# Patient Record
Sex: Female | Born: 1950
Health system: Southern US, Community
[De-identification: ages and names within clinical notes are randomized; demographics above are authoritative.]

## PROBLEM LIST (undated history)

## (undated) DIAGNOSIS — E049 Nontoxic goiter, unspecified: Secondary | ICD-10-CM

## (undated) DIAGNOSIS — M1990 Unspecified osteoarthritis, unspecified site: Secondary | ICD-10-CM

## (undated) DIAGNOSIS — I480 Paroxysmal atrial fibrillation: Secondary | ICD-10-CM

## (undated) DIAGNOSIS — I4891 Unspecified atrial fibrillation: Secondary | ICD-10-CM

## (undated) DIAGNOSIS — I10 Essential (primary) hypertension: Secondary | ICD-10-CM

## (undated) DIAGNOSIS — C4491 Basal cell carcinoma of skin, unspecified: Secondary | ICD-10-CM

## (undated) DIAGNOSIS — E785 Hyperlipidemia, unspecified: Secondary | ICD-10-CM

## (undated) DIAGNOSIS — G930 Cerebral cysts: Secondary | ICD-10-CM

## (undated) HISTORY — DX: Cerebral cysts: G93.0

## (undated) HISTORY — DX: Hyperlipidemia, unspecified: E78.5

## (undated) HISTORY — DX: Essential (primary) hypertension: I10

## (undated) HISTORY — PX: TONSILLECTOMY AND ADENOIDECTOMY: SHX28

## (undated) HISTORY — DX: Unspecified atrial fibrillation: I48.91

## (undated) HISTORY — PX: BASAL CELL CARCINOMA EXCISION: SHX1214

## (undated) HISTORY — DX: Basal cell carcinoma of skin, unspecified: C44.91

## (undated) HISTORY — DX: Paroxysmal atrial fibrillation: I48.0

## (undated) HISTORY — DX: Unspecified osteoarthritis, unspecified site: M19.90

## (undated) HISTORY — PX: HYSTERECTOMY ABDOMINAL WITH SALPINGECTOMY: SHX6725

## (undated) HISTORY — DX: Nontoxic goiter, unspecified: E04.9

---

## 1999-07-29 ENCOUNTER — Encounter: Admission: RE | Admit: 1999-07-29 | Discharge: 1999-07-29 | Payer: Self-pay | Admitting: Obstetrics and Gynecology

## 1999-07-29 ENCOUNTER — Encounter: Payer: Self-pay | Admitting: Obstetrics and Gynecology

## 2000-08-11 ENCOUNTER — Encounter: Payer: Self-pay | Admitting: Obstetrics and Gynecology

## 2000-08-11 ENCOUNTER — Encounter: Admission: RE | Admit: 2000-08-11 | Discharge: 2000-08-11 | Payer: Self-pay | Admitting: Obstetrics and Gynecology

## 2001-07-27 ENCOUNTER — Encounter: Payer: Self-pay | Admitting: Emergency Medicine

## 2001-07-27 ENCOUNTER — Emergency Department (HOSPITAL_COMMUNITY): Admission: EM | Admit: 2001-07-27 | Discharge: 2001-07-27 | Payer: Self-pay | Admitting: Emergency Medicine

## 2001-08-17 ENCOUNTER — Encounter: Payer: Self-pay | Admitting: Obstetrics and Gynecology

## 2001-08-17 ENCOUNTER — Encounter: Admission: RE | Admit: 2001-08-17 | Discharge: 2001-08-17 | Payer: Self-pay | Admitting: Obstetrics and Gynecology

## 2001-09-14 ENCOUNTER — Ambulatory Visit (HOSPITAL_COMMUNITY): Admission: RE | Admit: 2001-09-14 | Discharge: 2001-09-14 | Payer: Self-pay | Admitting: Obstetrics and Gynecology

## 2001-09-14 ENCOUNTER — Encounter: Payer: Self-pay | Admitting: Obstetrics and Gynecology

## 2007-01-04 ENCOUNTER — Encounter: Payer: Self-pay | Admitting: Family Medicine

## 2007-06-27 ENCOUNTER — Encounter: Admission: RE | Admit: 2007-06-27 | Discharge: 2007-06-27 | Payer: Self-pay | Admitting: Obstetrics and Gynecology

## 2008-07-04 ENCOUNTER — Ambulatory Visit (HOSPITAL_COMMUNITY): Admission: RE | Admit: 2008-07-04 | Discharge: 2008-07-04 | Payer: Self-pay | Admitting: Family Medicine

## 2009-01-02 ENCOUNTER — Encounter: Admission: RE | Admit: 2009-01-02 | Discharge: 2009-01-02 | Payer: Self-pay | Admitting: Endocrinology

## 2010-11-13 ENCOUNTER — Other Ambulatory Visit: Payer: Self-pay | Admitting: Endocrinology

## 2010-11-13 DIAGNOSIS — E041 Nontoxic single thyroid nodule: Secondary | ICD-10-CM

## 2011-01-05 ENCOUNTER — Other Ambulatory Visit: Payer: Self-pay

## 2011-01-05 ENCOUNTER — Ambulatory Visit
Admission: RE | Admit: 2011-01-05 | Discharge: 2011-01-05 | Disposition: A | Discharge status: Home / Self Care | Payer: BC Managed Care – PPO | Source: Ambulatory Visit | Attending: Endocrinology | Admitting: Endocrinology

## 2011-01-05 DIAGNOSIS — E041 Nontoxic single thyroid nodule: Secondary | ICD-10-CM

## 2011-02-03 ENCOUNTER — Ambulatory Visit (HOSPITAL_COMMUNITY)
Admission: RE | Admit: 2011-02-03 | Discharge: 2011-02-03 | Disposition: A | Discharge status: Home / Self Care | Payer: BC Managed Care – PPO | Source: Ambulatory Visit | Attending: Physical Medicine and Rehabilitation | Admitting: Physical Medicine and Rehabilitation

## 2011-02-03 DIAGNOSIS — IMO0001 Reserved for inherently not codable concepts without codable children: Secondary | ICD-10-CM | POA: Insufficient documentation

## 2011-02-03 DIAGNOSIS — M545 Low back pain, unspecified: Secondary | ICD-10-CM | POA: Insufficient documentation

## 2011-02-03 DIAGNOSIS — M25559 Pain in unspecified hip: Secondary | ICD-10-CM | POA: Insufficient documentation

## 2011-02-03 DIAGNOSIS — M79609 Pain in unspecified limb: Secondary | ICD-10-CM | POA: Insufficient documentation

## 2011-02-03 DIAGNOSIS — M6281 Muscle weakness (generalized): Secondary | ICD-10-CM | POA: Insufficient documentation

## 2011-02-03 NOTE — Progress Notes (Signed)
Physical Therapy Evaluation  Patient Details  Name: MARJORIE DEPREY MRN: [id number] Date of Birth: 1951/01/21  Today's Date: 02/03/2011 Time: 0105-0146 Time Calculation (min): 41 min Visit#: 1  of 8   Re-eval: 03/05/11 Assessment Diagnosis: DJD Next MD Visit: 02/25/11 Prior Therapy: none  Past Medical History: No past medical history on file. Past Surgical History: No past surgical history on file.  Subjective Symptoms/Limitations Symptoms: Ms. Stillson states a year and a half ago she started have pain in the upper part of her posterior thigh driving home from [place].  The patient states that whenever she rides in a car  she has significant increased pain .  She states that her pain also increases whenever she bends forward.  She had a MRI a month ago which showed some degeneration at L5-S1.  She has tried alleve and ibuprofen with some relief.  She was given prednisone and the pain went completely away but it came back within a week.  She is now being referred to therapy to improve her functional ability.  Limitations: Sitting How long can you sit comfortably?: The patient states that she is able to sit for less than ten minutes. How long can you stand comfortably?: The patient states that her back does not bother her when she is standing How long can you walk comfortably?: No pain with walking Pain Assessment Currently in Pain?: Yes Pain Score:   1 (pain increases to a 4 with car riding.) Pain Location: Back Pain Orientation: Right Pain Type: Acute pain Pain Radiating Towards: right thight Pain Onset: More than a month ago Pain Frequency: Intermittent Pain Relieving Factors: standing up Effect of Pain on Daily Activities: sitting, bending and climbing steps increases pain.  Assessment RLE Strength Right Hip Flexion: 5/5 Right Hip Extension: 4/5 Right Hip ABduction: 5/5 Right Hip ADduction: 5/5 Right Knee Flexion: 5/5 Right Knee Extension: 5/5 Right Ankle Dorsiflexion:  5/5 Right Ankle Plantar Flexion: 5/5 Lumbar AROM Lumbar Flexion: decreased 20% reps increases Lumbar Extension: wnl reps no change Lumbar - Right Side Bend: wnl  Lumbar - Left Side Bend: wnl Lumbar - Right Rotation: wnl Lumbar - Left Rotation: wnl  Exercise/Treatments Stretches Active Hamstring Stretch: 3 reps;30 seconds Single Knee to Chest Stretch: 3 reps;30 seconds Lumbar Exercises  stand extension x 5 Stability Ab Set: 10 reps   Physical Therapy Assessment and Plan PT Assessment and Plan Clinical Impression Statement: Pt with pain affecting her ability to sit, car ride and work in the yard.  Pt will be benefit from skilled PT to return pt to prior level and improve her quality of life. Rehab Potential: Good Clinical Impairments Affecting Rehab Potential: weak trunk stabilizer, pain PT Frequency: Min 2X/week PT Duration: 4 weeks PT Treatment/Interventions: Therapeutic exercise;Functional mobility training;Patient/family education PT Plan: begin bent knee lift, bridge, hip flexion isometrically, SL abduction and all 4 piriformis stretch to next treatment.    Goals Home Exercise Program Pt will Perform Home Exercise Program: Independently PT Short Term Goals Time to Complete Short Term Goals: 2 weeks PT Short Term Goal 1: Patient to state that she is able to sit for 30 min. without incrased Pain PT Short Term Goal 2: Pt to be able to demonstrate and verbalize the importane of HEP PT Short Term Goal 3: Pain level to be no greater than a 2 PT Short Term Goal 4: ROM WFL PT Long Term Goals Time to Complete Long Term Goals: 4 weeks PT Long Term Goal 1: I in advance HEP  PT Long Term Goal 2: Pt to state she had had no leg pain in a week period Long Term Goal 3: Pain level at the most a 1 Long Term Goal 4: Pt able to sit for 1 1/2 hrs. without incrased pain. PT Long Term Goal 5: Able to work out in yard without discomfort  Problem List Patient Active Problem List  Diagnoses    . Pain in joint, pelvic region and thigh    PT - End of Session Activity Tolerance: Patient tolerated treatment well General Behavior During Session: [facility] for tasks performed Cognition: [facility] for tasks performed   Shynia Daleo,CINDY 02/03/2011, 4:31 PM  Physician Documentation Your signature is required to indicate approval of the treatment plan as stated above.  Please sign and either send electronically or make a copy of this report for your files and return this physician signed original.   Please mark one 1.__approve of plan  2. ___approve of plan with the following conditions.   ______________________________                                                          _____________________ Physician Signature                                                                                                             Date

## 2011-02-03 NOTE — Patient Instructions (Addendum)
HEP

## 2011-02-10 ENCOUNTER — Ambulatory Visit (HOSPITAL_COMMUNITY)
Admission: RE | Admit: 2011-02-10 | Discharge: 2011-02-10 | Disposition: A | Discharge status: Home / Self Care | Payer: BC Managed Care – PPO | Source: Ambulatory Visit | Attending: Physical Medicine and Rehabilitation | Admitting: Physical Medicine and Rehabilitation

## 2011-02-10 NOTE — Progress Notes (Signed)
Physical Therapy Treatment Patient Details  Name: Paige Jones MRN: [id number] Date of Birth: 09/24/1950  Today's Date: 02/10/2011 Time: 2130-8657 Time Calculation (min): 34 min Visit#: 2  of 8   Re-eval: 03/05/11 Charges: Therex x 32'  Subjective: Symptoms/Limitations Symptoms: Pt reprots pain in her R hip only when she moves a certain way. Pain Assessment Currently in Pain?: Yes Pain Score:   2 Pain Location: Hip Pain Orientation: Right  Exercise/Treatments Stretches Active Hamstring Stretch: 3 reps;30 seconds Single Knee to Chest Stretch: 3 reps;30 seconds Piriformis Stretch: 3 reps;30 seconds;Limitations Piriformis Stretch Limitations: quadruped Stability Bent Knee Raise: 10 reps Ab Set: 10 reps Isometric Hip Flexion: 10 reps Hip Abduction: 10 reps;Side-lying  Physical Therapy Assessment and Plan PT Assessment and Plan Clinical Impression Statement: Pt completes therex with good core control. Pt requires minimal cueing for proper technique. Pt w/o c/o increased pain thourghout session. PT Treatment/Interventions: Therapeutic exercise PT Plan: Continue per PT POC.    Goals    Problem List Patient Active Problem List  Diagnoses  . Pain in joint, pelvic region and thigh    PT - End of Session Activity Tolerance: Patient tolerated treatment well General Behavior During Session: [facility] for tasks performed Cognition: [facility] for tasks performed  Antonieta Iba 02/10/2011, 5:20 PM

## 2011-02-17 ENCOUNTER — Ambulatory Visit (HOSPITAL_COMMUNITY)
Admission: RE | Admit: 2011-02-17 | Discharge: 2011-02-17 | Disposition: A | Discharge status: Home / Self Care | Payer: BC Managed Care – PPO | Source: Ambulatory Visit | Attending: Family Medicine | Admitting: Family Medicine

## 2011-02-17 NOTE — Progress Notes (Signed)
Physical Therapy Treatment Patient Details  Name: DEBBERA WOLKEN MRN: [id number] Date of Birth: [birth year]  Today's Date: 02/17/2011 Time: 4098-1191 Time Calculation (min): 39 min Visit#: 3  of 8   Re-eval: 03/05/11 Charges: Therex x 38'  Subjective: Symptoms/Limitations Symptoms: Pt reports that she did some yardwork yesterday and she's a little tight. Pain Assessment Currently in Pain?: No/denies Pain Score: 0-No pain   Exercise/Treatments Stretches Active Hamstring Stretch: 3 reps;30 seconds Single Knee to Chest Stretch: 3 reps;30 seconds Piriformis Stretch: 3 reps;30 seconds;Limitations Piriformis Stretch Limitations: quadruped Stability Bent Knee Raise: 15 reps Isometric Hip Flexion: 15 reps Large Ball Abdominal Isometric: 10 reps Large Ball Oblique Isometric: 10 reps Straight Leg Raise: 10 reps Hip Abduction: 10 reps;Side-lying;Weights Hip Abduction Weights (lbs): 3 Single Arm Raise: 10 reps;Prone Leg Raise: 10 reps;Prone Opposite Arm/Leg Raise: 10 reps;Prone  Physical Therapy Assessment and Plan PT Assessment and Plan Clinical Impression Statement: Pt completes all exercises with proper form and good control. Began pbal abdominal isometric exercise to improve core strength. Began prone ex to improve hip ext and back strength. PT Plan: Continue to progress strength flexibility.     Problem List Patient Active Problem List  Diagnoses  . Pain in joint, pelvic region and thigh    PT - End of Session Activity Tolerance: Patient tolerated treatment well General Behavior During Session: [facility] for tasks performed Cognition: [facility] for tasks performed  Antonieta Iba 02/17/2011, 1:55 PM

## 2011-02-18 ENCOUNTER — Ambulatory Visit (HOSPITAL_COMMUNITY): Payer: BC Managed Care – PPO | Admitting: Physical Therapy

## 2011-02-19 ENCOUNTER — Ambulatory Visit (HOSPITAL_COMMUNITY): Payer: BC Managed Care – PPO | Admitting: *Deleted

## 2011-02-23 ENCOUNTER — Ambulatory Visit (HOSPITAL_COMMUNITY): Payer: BC Managed Care – PPO | Admitting: Physical Therapy

## 2011-02-23 ENCOUNTER — Telehealth (HOSPITAL_COMMUNITY): Payer: Self-pay

## 2011-02-25 ENCOUNTER — Ambulatory Visit (HOSPITAL_COMMUNITY): Payer: BC Managed Care – PPO | Admitting: Physical Therapy

## 2011-02-27 ENCOUNTER — Ambulatory Visit (HOSPITAL_COMMUNITY)
Admission: RE | Admit: 2011-02-27 | Discharge: 2011-02-27 | Disposition: A | Discharge status: Home / Self Care | Payer: BC Managed Care – PPO | Source: Ambulatory Visit | Attending: Physical Medicine and Rehabilitation | Admitting: Physical Medicine and Rehabilitation

## 2011-02-27 DIAGNOSIS — M6281 Muscle weakness (generalized): Secondary | ICD-10-CM | POA: Insufficient documentation

## 2011-02-27 DIAGNOSIS — M545 Low back pain, unspecified: Secondary | ICD-10-CM | POA: Insufficient documentation

## 2011-02-27 DIAGNOSIS — M25559 Pain in unspecified hip: Secondary | ICD-10-CM

## 2011-02-27 DIAGNOSIS — M79609 Pain in unspecified limb: Secondary | ICD-10-CM | POA: Insufficient documentation

## 2011-02-27 DIAGNOSIS — IMO0001 Reserved for inherently not codable concepts without codable children: Secondary | ICD-10-CM | POA: Insufficient documentation

## 2011-02-27 NOTE — Progress Notes (Signed)
Physical Therapy Treatment Patient Details  Name: Paige Jones MRN: [id number] Date of Birth: 1951-01-02  Today's Date: 02/27/2011 Time: 4098-1191 Time Calculation (min): 42 min Visit#: 4  of 8   Re-eval: 03/05/11  charge There ex 40  Subjective: Symptoms/Limitations Symptoms: Pt states that she is doing pretty good today Pain Assessment Currently in Pain?: No/denies    Exercise/Treatments Stretches Active Hamstring Stretch: 3 reps;30 seconds Single Knee to Chest Stretch: 3 reps;30 seconds Piriformis Stretch: 3 reps;30 seconds;Limitations Piriformis Stretch Limitations: quadruped Lumbar Exercises   Stability Dead Bug: 15 reps Bridge: 15 reps;Limitations Bridge Limitations: legs on ball Large Ball Abdominal Isometric: 15 reps Large Ball Oblique Isometric: 15 reps Straight Leg Raise: 15 reps Hip Abduction: 10 reps;Side-lying;Weights Hip Abduction Weights (lbs): 3 Single Arm Raise: Prone;15 reps Leg Raise: Prone;15 reps Opposite Arm/Leg Raise: Prone;15 reps  Physical Therapy Assessment and Plan PT Assessment and Plan Clinical Impression Statement: Good technique with all exercise.  Added dead bug. Rehab Potential: Good PT Plan: begin all 4 SAR/SLR/ opposite arm/leg    Goals    Problem List Patient Active Problem List  Diagnoses  . Pain in joint, pelvic region and thigh    PT - End of Session Activity Tolerance: Patient tolerated treatment well General Behavior During Session: [facility] for tasks performed Cognition: [facility] for tasks performed  RUSSELL,CINDY 02/27/2011, 1:46 PM

## 2011-03-02 ENCOUNTER — Ambulatory Visit (HOSPITAL_COMMUNITY)
Admission: RE | Admit: 2011-03-02 | Discharge: 2011-03-02 | Disposition: A | Discharge status: Home / Self Care | Payer: BC Managed Care – PPO | Source: Ambulatory Visit | Attending: Family Medicine | Admitting: Family Medicine

## 2011-03-02 NOTE — Progress Notes (Signed)
Physical Therapy Treatment Patient Details  Name: Paige Jones MRN: [id number] Date of Birth: [birth year]  Today's Date: 03/02/2011 Time: 2952-8413 Time Calculation (min): 40 min Visit#: 5  of 8   Re-eval: 03/05/11 Charges:  therex 38'    Subjective: Symptoms/Limitations Symptoms: Mainly pain on the Right side, but most of it is at the crease of glutes; was sitting on a stool over weekend and has been sore there since. Pain Assessment Currently in Pain?: Yes Pain Score:   4 Pain Location: Hip Pain Orientation: Right   Exercise/Treatments Stretches Active Hamstring Stretch: 3 reps;30 seconds Single Knee to Chest Stretch: 3 reps;30 seconds Piriformis Stretch: 3 reps;30 seconds;Limitations Piriformis Stretch Limitations: supine with towel Stability Dead Bug: 15 reps Bridge: 15 reps Bridge Limitations: legs on ball Isometric Hip Flexion: 15 reps Large Ball Abdominal Isometric: 15 reps Large Ball Oblique Isometric: 15 reps Straight Leg Raise: 15 reps Hip Abduction: 15 reps Hip Abduction Weights (lbs): 3 Single Arm Raise: Quadruped;5 reps Leg Raise: 5 reps;Other (comment) (quadruped) Opposite Arm/Leg Raise: Quadruped;5 reps  Physical Therapy Assessment and Plan PT Assessment and Plan Clinical Impression Statement: Pt. required minimal  cues with therex; increasing strength and stability.  Progressed to quadruped with most difficulty stabilizing L UE/R LE. PT Plan: Re-evaluate next visit.     Problem List Patient Active Problem List  Diagnoses  . Pain in joint, pelvic region and thigh    PT - End of Session Activity Tolerance: Patient tolerated treatment well General Behavior During Session: [facility] for tasks performed Cognition: [facility] for tasks performed  Emeline Gins B 03/02/2011, 1:47 PM

## 2011-03-04 ENCOUNTER — Ambulatory Visit (HOSPITAL_COMMUNITY)
Admission: RE | Admit: 2011-03-04 | Discharge: 2011-03-04 | Disposition: A | Discharge status: Home / Self Care | Payer: BC Managed Care – PPO | Source: Ambulatory Visit | Attending: Family Medicine | Admitting: Family Medicine

## 2011-03-04 DIAGNOSIS — M25559 Pain in unspecified hip: Secondary | ICD-10-CM

## 2011-03-04 NOTE — Progress Notes (Signed)
Physical Therapy Treatment Patient Details  Name: Paige Jones MRN: [id number] Date of Birth: Jul 13, 1950  Today's Date: 03/04/2011 Time: 4098-1191 Time Calculation (min): 38 min Visit#: 6  of 8   Re-eval: 03/05/11  Charge: therex 30 min ROM measurement 1 unit MMT 1 unit  Subjective: Symptoms/Limitations Symptoms: No pain while I'm just standing here, major pain following sitting for long periods of time in R gluteal region. Pain Assessment Currently in Pain?: No/denies  Objective:   Exercise/Treatments Stretches Active Hamstring Stretch: 3 reps;30 seconds Single Knee to Chest Stretch: 1 rep;30 seconds Stability Dead Bug: 15 reps Bridge: 15 reps Bridge Limitations: legs on ball Large Ball Abdominal Isometric: 15 reps Hip Abduction: 15 reps Hip Abduction Weights (lbs): 3 Single Arm Raise: Quadruped;15 reps Leg Raise: 15 reps Opposite Arm/Leg Raise: 10 reps Functional Squats: 10 reps  Physical Therapy Assessment and Plan PT Assessment and Plan Clinical Impression Statement: Reeval complete with all STG/LTG have been met or progressing.  Pt able to demonstrate/verbalize current HEP correctly with no cueing required for proper form/tech.  Pt given advance HEP printout and able to demonstrate all correctly.  ROM and strength all WNL.  Pt  able to sit for 1 1/2 hours without increased pain.  Everyone is agreement for discharge to HEP today, pt encouraged and stated interest in joining [facility] or other exercise group to continue the lumbar strengthening/stability outside of the therapy.   PT Plan: D/C to HEP.    Goals Home Exercise Program Pt will Perform Home Exercise Program: Independently PT Goal: Perform Home Exercise Program - Progress: Met PT Short Term Goals Time to Complete Short Term Goals: 2 weeks PT Short Term Goal 1: Patient to state that she is able to sit for 30 min. without incrased Pain PT Short Term Goal 1 - Progress: Met PT Short Term Goal 2: Pt to be able  to demonstrate and verbalize the importane of HEP PT Short Term Goal 2 - Progress: Met PT Short Term Goal 3: Pain level to be no greater than a 2 PT Short Term Goal 3 - Progress: Met PT Short Term Goal 4 - Progress: Met PT Long Term Goals Time to Complete Long Term Goals: 4 weeks PT Long Term Goal 1: I in advance HEP PT Long Term Goal 1 - Progress: Met PT Long Term Goal 2: Pt to state she had had no leg pain in a week period PT Long Term Goal 2 - Progress: Progressing toward goal Long Term Goal 3: Pain level at the most a 1 Long Term Goal 3 Progress: Progressing toward goal Long Term Goal 4: Pt able to sit for 1 1/2 hrs. without incrased pain. Long Term Goal 4 Progress: Met Long Term Goal 5 Progress: Partly met  Problem List Patient Active Problem List  Diagnoses  . Pain in joint, pelvic region and thigh    PT - End of Session Activity Tolerance: Patient tolerated treatment well General Behavior During Session: [facility] for tasks performed Cognition: [facility] for tasks performed  Juel Burrow 03/04/2011, 1:52 PM

## 2011-03-06 ENCOUNTER — Ambulatory Visit (HOSPITAL_COMMUNITY): Payer: BC Managed Care – PPO | Admitting: Physical Therapy

## 2012-01-25 DIAGNOSIS — C4491 Basal cell carcinoma of skin, unspecified: Secondary | ICD-10-CM | POA: Insufficient documentation

## 2012-10-24 ENCOUNTER — Other Ambulatory Visit: Payer: Self-pay | Admitting: Endocrinology

## 2012-10-24 DIAGNOSIS — E049 Nontoxic goiter, unspecified: Secondary | ICD-10-CM

## 2012-12-05 ENCOUNTER — Other Ambulatory Visit: Payer: BC Managed Care – PPO

## 2012-12-06 ENCOUNTER — Ambulatory Visit
Admission: RE | Admit: 2012-12-06 | Discharge: 2012-12-06 | Disposition: A | Discharge status: Home / Self Care | Payer: BC Managed Care – PPO | Source: Ambulatory Visit | Attending: Endocrinology | Admitting: Endocrinology

## 2012-12-06 DIAGNOSIS — E049 Nontoxic goiter, unspecified: Secondary | ICD-10-CM

## 2013-07-17 LAB — HM COLONOSCOPY

## 2013-12-19 ENCOUNTER — Other Ambulatory Visit: Payer: Self-pay | Admitting: Endocrinology

## 2013-12-19 DIAGNOSIS — E049 Nontoxic goiter, unspecified: Secondary | ICD-10-CM

## 2014-07-12 LAB — CBC AND DIFFERENTIAL
HCT: 38 % (ref 36–46)
HEMOGLOBIN: 12.6 g/dL (ref 12.0–16.0)
PLATELETS: 222 10*3/uL (ref 150–399)
WBC: 8.6 10^3/mL

## 2014-11-08 ENCOUNTER — Other Ambulatory Visit: Payer: Self-pay | Admitting: Endocrinology

## 2014-11-08 DIAGNOSIS — E049 Nontoxic goiter, unspecified: Secondary | ICD-10-CM

## 2014-12-12 ENCOUNTER — Other Ambulatory Visit: Payer: BC Managed Care – PPO

## 2015-04-30 ENCOUNTER — Encounter: Payer: Self-pay | Admitting: *Deleted

## 2015-05-07 ENCOUNTER — Other Ambulatory Visit: Payer: Self-pay | Admitting: *Deleted

## 2015-05-07 ENCOUNTER — Ambulatory Visit (INDEPENDENT_AMBULATORY_CARE_PROVIDER_SITE_OTHER): Payer: PPO | Admitting: Orthopaedic Surgery

## 2015-05-07 ENCOUNTER — Ambulatory Visit (INDEPENDENT_AMBULATORY_CARE_PROVIDER_SITE_OTHER): Payer: PPO

## 2015-05-07 ENCOUNTER — Ambulatory Visit: Payer: PPO

## 2015-05-07 ENCOUNTER — Encounter: Payer: Self-pay | Admitting: Orthopaedic Surgery

## 2015-05-07 VITALS — BP 128/79 | HR 78 | Temp 97.5°F | Resp 16 | Ht 62.0 in | Wt 154.0 lb

## 2015-05-07 DIAGNOSIS — M20011 Mallet finger of right finger(s): Secondary | ICD-10-CM

## 2015-05-07 DIAGNOSIS — M79644 Pain in right finger(s): Secondary | ICD-10-CM | POA: Diagnosis not present

## 2015-05-07 DIAGNOSIS — M1811 Unilateral primary osteoarthritis of first carpometacarpal joint, right hand: Secondary | ICD-10-CM | POA: Diagnosis not present

## 2015-05-07 DIAGNOSIS — M79641 Pain in right hand: Secondary | ICD-10-CM

## 2015-05-07 DIAGNOSIS — IMO0001 Reserved for inherently not codable concepts without codable children: Secondary | ICD-10-CM | POA: Insufficient documentation

## 2015-05-07 NOTE — Progress Notes (Signed)
Patient IJ:[id number] Paige Jones, female DOB:1950-08-11, 65 y.o. FQ:[id number]  Chief Complaint  Patient presents with  . Hand Problem    Right hand and wrist pain and swelling, no known injury    HPI  NIAMBI HYETT is a 65 y.o. female who is seen for the first time.  She has history of long finger pain at the DIP joint since October, getting slowly worse.  It cannot fully extend and has swelling and some redness first thing in the morning.  She has no numbness.  She has stiffness first thing in the morning.  She also has had pain and swelling of the right thumb with gradually decreasing ability to grab a cup or a jar as the web space is not as open as before.  I reviewed x-rays she had from 2014 and also a report she had from another doctor.  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  She has had no trauma since then.  She has been on diclofenac but only takes one a day.  She sees no help from this.   Hand Pain  The incident occurred more than 1 week ago. The incident occurred at home. The pain is present in the right hand. The quality of the pain is described as aching. The pain does not radiate. The pain is at a severity of 2/10. The pain is mild. The pain has been worsening since the incident. She has tried ice for the symptoms. The treatment provided mild relief.    Body mass index is 28.16 kg/(m^2).  Review of Systems  Patient does not have Diabetes Mellitus. Patient does not have hypertension. Patient does not have COPD or shortness of breath. Patient does not have BMI > 35. Patient does not have current smoking history.  Review of Systems  No past medical history on file.  No past surgical history on file.  No family history on file.  Social History Social History  Substance Use Topics  . Smoking status: Never Smoker   . Smokeless tobacco: None  . Alcohol Use: None    No Known Allergies  Current Outpatient Prescriptions  Medication  Sig Dispense Refill  . aspirin 81 MG tablet 81 mg.    . atorvastatin (LIPITOR) 20 MG tablet 20 mg.    . BIOTIN PO Take by mouth.    . Calcium Carbonate-Vit D-Min (CALCIUM 1200 PO) Take by mouth.    . CETIRIZINE HCL PO Take by mouth.    . diclofenac (VOLTAREN) 75 MG EC tablet 75 mg.    . estradiol (ESTRACE) 0.5 MG tablet 0.5 mg.    . FENOFIBRATE PO Take 160 mg by mouth.    . levothyroxine (SYNTHROID, LEVOTHROID) 75 MCG tablet Take 75 mcg by mouth.    . Multiple Vitamins-Minerals (MULTIVITAMIN PO) Take by mouth.    . Omega-3 Fatty Acids (FISH OIL) 1000 MG CAPS Take by mouth.    . vitamin E (VITAMIN E) 400 UNIT capsule 400 Units.     No current facility-administered medications for this visit.     Physical Exam  Blood pressure 128/79, pulse 78, temperature 97.5 F (36.4 C), resp. rate 16, height 5\' 2"  (1.575 m), weight 154 lb (69.854 kg).  Constitutional: overall normal hygiene, normal nutrition, well developed, normal grooming, normal body habitus. Assistive device:none  Musculoskeletal: gait and station Limp none, muscle tone and strength are normal, no tremors or atrophy is present.  .  Neurological: coordination overall normal.  Deep  tendon reflex/nerve stretch intact.  Sensation normal.  Cranial nerves II-XII intact.   Skin:normal overall no scars, lesions, ulcers or rash es. No psoriasis.  Psychiatric: Alert and oriented x 3.  Recent memory intact, remote memory unclear.  Normal mood and affect. Well groomed.  Good eye contact.  Cardiovascular: overall no swelling, no varicosities, no edema bilaterally, normal temperatures of the legs and arms, no clubbing, cyanosis and good capillary refill.  Lymphatic: palpation is normal.   Extremities:the right hand is tender at the base of her thumb at the metacarpal carpal joint with slight swelling and no redness.  NV is intact  Her ability to fully open the web space area between the thumb and the index is decreased by half compared  to the left hand which opens fully.  She is right hand dominant. The right long finger DIP joint has some swelling and slight redness but no fluctuance.  It is tender.  NV is intact. Inspection slight redness of dorsum of long finger at DIP Strength and tone normal Range of motion the long finger has a 15 degree extension lag.  I can passively fully move the finger to extension.  The thumb web space ability to grab objects is decreased on the right.  Additional services performed: review of medical records and previous x-rays.  X-rays done today of the hand and long finger showing mallet finger and significant degenerative changes of the carpal metacarpal joint of thumb.  The long finger was splinted with dorsum aluminum splint for the DIP joint.  PLAN Call if any problems.  Precautions discussed.  Stop diclofenac, begin Aleve one bid pc.  Return to clinic to see hand surgeon.

## 2015-05-07 NOTE — Patient Instructions (Addendum)
We will refer you to hand specialist  .t

## 2015-05-08 ENCOUNTER — Telehealth: Payer: Self-pay | Admitting: *Deleted

## 2015-05-08 NOTE — Telephone Encounter (Addendum)
Referral and office notes faxed to Dr. Ninfa Linden at [facility]. Awaiting appointment.  Patient was scheduled for 05/15/15 and a follow up with them 06/19/15

## 2015-05-15 DIAGNOSIS — M20011 Mallet finger of right finger(s): Secondary | ICD-10-CM | POA: Diagnosis not present

## 2015-05-15 DIAGNOSIS — M19041 Primary osteoarthritis, right hand: Secondary | ICD-10-CM | POA: Diagnosis not present

## 2015-05-22 DIAGNOSIS — C44519 Basal cell carcinoma of skin of other part of trunk: Secondary | ICD-10-CM | POA: Diagnosis not present

## 2015-05-22 DIAGNOSIS — C44219 Basal cell carcinoma of skin of left ear and external auricular canal: Secondary | ICD-10-CM | POA: Diagnosis not present

## 2015-05-22 DIAGNOSIS — L570 Actinic keratosis: Secondary | ICD-10-CM | POA: Diagnosis not present

## 2015-05-22 DIAGNOSIS — E039 Hypothyroidism, unspecified: Secondary | ICD-10-CM | POA: Diagnosis not present

## 2015-05-23 ENCOUNTER — Ambulatory Visit (HOSPITAL_COMMUNITY): Payer: PPO | Attending: Orthopaedic Surgery | Admitting: Occupational Therapy

## 2015-05-23 ENCOUNTER — Encounter (HOSPITAL_COMMUNITY): Payer: Self-pay | Admitting: Occupational Therapy

## 2015-05-23 DIAGNOSIS — M20011 Mallet finger of right finger(s): Secondary | ICD-10-CM | POA: Diagnosis not present

## 2015-05-23 NOTE — Therapy (Addendum)
[facility] [address] [place], [place], [address] Phone: [phone]   Fax:  [phone]  Occupational Therapy Evaluation  Patient Details  Name: Paige Jones MRN: [id number] Date of Birth: [birth year] Referring Provider: Dr. Jean Rosenthal  Encounter Date: 05/23/2015      OT End of Session - 05/23/15 1640    Visit Number 1   Number of Visits 1   Date for OT Re-Evaluation 06/06/15   Authorization Type [employer]   OT Start Time 1305   OT Stop Time 1340   OT Time Calculation (min) 35 min   Activity Tolerance Patient tolerated treatment well   Behavior During Therapy [facility] for tasks assessed/performed      History reviewed. No pertinent past medical history.  No past surgical history on file.  There were no vitals filed for this visit.  Visit Diagnosis:  Mallet finger, acquired, right      Subjective Assessment - 05/23/15 1633    Subjective  S: I hope this helps since I waited so long to have my finger looked at.    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 Pt was referred to occupational therapy by Dr. Jean Rosenthal.    Patient Stated Goals To have my finger extend correctly   Currently in Pain? No/denies           [facility] OT Assessment - 05/23/15 1635    Assessment   Diagnosis right chronic mallet finger-middle finger   Referring Provider Dr. Jean Rosenthal   Prior Therapy none   Precautions   Precautions None   Balance Screen   Has the patient fallen in the past 6 months No   Has the patient had a decrease in activity level because of a fear of falling?  No   Is the patient reluctant to leave their home because of a fear of falling?  No   Prior Function   Level of Independence Independent with basic ADLs   Written Expression   Dominant Hand Right   Cognition   Overall Cognitive Status Within  Functional Limits for tasks assessed                  OT Treatments/Exercises (OP) - 05/23/15 1636    Splinting   Splinting Pt fitted with custom fabricated mallet splint for the right middle finger. Splint fabricated with dorsal and volar aspects, volar portion extending from tip of finger and ending immediately prior to PIP joint, dorsal portion extending from DIP joint to PIP joint. DIP joint positioned in neutral, with DIP blocked on both dorsal and volar sides of the finger.                OT Education - 05/23/15 1640    Education provided Yes   Education Details splint wear and care   Person(s) Educated Patient   Methods Explanation;Handout   Comprehension Verbalized understanding          OT Short Term Goals - 05/23/15 1644    OT SHORT TERM GOAL #1   Title Pt will be educated on splint wear and care.    Time 1   Period Days   Status Achieved                  Plan - 05/23/15 1641    Clinical Impression Statement A: Pt is a 65 y/o female presenting for right  middle finger mallet splint. Pt fitted with custom fabricated splint with dorsal and volar aspects, volar portion extending from tip of finger and ending immediately prior to PIP joint, dorsal portion extending from DIP joint to PIP joint. DIP joint positioned in neutral, with DIP blocked on both dorsal and volar sides of the finger; splint allows for flexion of PIP joint. Pt educated on splint wear and care, instructed to call if adjustments are necessary.     Pt will benefit from skilled therapeutic intervention in order to improve on the following deficits (Retired) Impaired UE functional use   Rehab Potential Good   OT Frequency 1x / week   OT Duration --  1 visit   OT Treatment/Interventions Splinting;Patient/family education   Plan P: pt fitted with custom fabricated splint, with instructions to call if adjustments are needed.    OT Home Exercise Plan splint wear and care   Consulted  and Agree with Plan of Care Patient     G-Codes: Clinical judgement-Carrying, moving, handling objects Goal: At least 1 percent but less than 20 percent impaired, limited, or restricted. Discharge: At least 1 percent but less than 20 percent impaired, limited, or restricted.    Problem List Patient Active Problem List   Diagnosis Date Noted  . Mallet deformity of third finger, right acquired 05/07/2015  . Primary osteoarthritis of first carpometacarpal joint of right hand 05/07/2015  . Pain in joint, pelvic region and thigh 02/03/2011    Guadelupe Sabin, OTR/L  [phone]  05/23/2015, 4:45 PM  [facility] [address] [place], [place], [address] Phone: [phone]   Fax:  [phone]  Name: Paige Jones MRN: [id number] Date of Birth: 14-Jan-1951

## 2015-05-29 ENCOUNTER — Ambulatory Visit
Admission: RE | Admit: 2015-05-29 | Discharge: 2015-05-29 | Disposition: A | Discharge status: Home / Self Care | Payer: PPO | Source: Ambulatory Visit | Attending: Endocrinology | Admitting: Endocrinology

## 2015-05-29 DIAGNOSIS — E049 Nontoxic goiter, unspecified: Secondary | ICD-10-CM

## 2015-05-29 DIAGNOSIS — E041 Nontoxic single thyroid nodule: Secondary | ICD-10-CM | POA: Diagnosis not present

## 2015-05-31 DIAGNOSIS — E039 Hypothyroidism, unspecified: Secondary | ICD-10-CM | POA: Diagnosis not present

## 2015-05-31 DIAGNOSIS — E049 Nontoxic goiter, unspecified: Secondary | ICD-10-CM | POA: Diagnosis not present

## 2015-05-31 DIAGNOSIS — I10 Essential (primary) hypertension: Secondary | ICD-10-CM | POA: Diagnosis not present

## 2015-06-03 ENCOUNTER — Ambulatory Visit (INDEPENDENT_AMBULATORY_CARE_PROVIDER_SITE_OTHER): Payer: PPO | Admitting: Neurology

## 2015-06-03 ENCOUNTER — Encounter: Payer: Self-pay | Admitting: Neurology

## 2015-06-03 VITALS — BP 134/68 | HR 70 | Ht 62.0 in | Wt 153.0 lb

## 2015-06-03 DIAGNOSIS — G930 Cerebral cysts: Secondary | ICD-10-CM | POA: Diagnosis not present

## 2015-06-03 NOTE — Progress Notes (Signed)
Chart forwarded.  

## 2015-06-03 NOTE — Patient Instructions (Signed)
I think the cyst is benign and I wouldn't pursue surgery. However, we will repeat MRI of brain with and without contrast to follow up on it.  Bring the disc of the old MRI with you, as the radiologist may need it to compare  Will contact you with results and whether further management or follow up is required.

## 2015-06-03 NOTE — Progress Notes (Signed)
NEUROLOGY CONSULTATION NOTE  ABCDE SPEZIA MRN: [id number] DOB: 01/26/1951  Referring provider: Roe Coombs, PA Primary care provider: Anastasia Pall, MD  Reason for consult:  Arachnoid cyst.  HISTORY OF PRESENT ILLNESS: Paige Jones is a 65 year old right-handed female who presents for abnormal MRI.  History obtained by patient and her husband.  Imaging of brain MRI reviewed.  About a year ago, she began having headaches.  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  The headaches were found to be secondary to Crestor and they resolved after discontinuation.  However, it was advised that she should have a repeat MRI in one year to monitor for any change.  She is feeling well.  She denies episodes of memory gaps.  She denies episodes of phantosmia or altered mental status.  PAST MEDICAL HISTORY: History reviewed. No pertinent past medical history.  PAST SURGICAL HISTORY: History reviewed. No pertinent past surgical history.  MEDICATIONS: Current Outpatient Prescriptions on File Prior to Visit  Medication Sig Dispense Refill  . aspirin 81 MG tablet 81 mg.    . atorvastatin (LIPITOR) 20 MG tablet 20 mg.    . BIOTIN PO Take by mouth.    . Calcium Carbonate-Vit D-Min (CALCIUM 1200 PO) Take by mouth.    . CETIRIZINE HCL PO Take by mouth.    . diclofenac (VOLTAREN) 75 MG EC tablet 75 mg.    . estradiol (ESTRACE) 0.5 MG tablet 0.5 mg.    . FENOFIBRATE PO Take 160 mg by mouth.    . levothyroxine (SYNTHROID, LEVOTHROID) 75 MCG tablet Take 75 mcg by mouth.    . Multiple Vitamins-Minerals (MULTIVITAMIN PO) Take by mouth.    . Omega-3 Fatty Acids (FISH OIL) 1000 MG CAPS Take by mouth.    . vitamin E (VITAMIN E) 400 UNIT capsule 400 Units.     No current facility-administered medications on file prior to visit.    ALLERGIES: Allergies  Allergen Reactions  . Pseudoephedrine      Other reaction(s): Other Elevated BP  . Rosuvastatin     Other reaction(s): Other headache    FAMILY HISTORY: History reviewed. No pertinent family history.  SOCIAL HISTORY: Social History   Social History  . Marital Status: Married    Spouse Name: N/A  . Number of Children: N/A  . Years of Education: N/A   Occupational History  . Not on file.   Social History Main Topics  . Smoking status: Never Smoker   . Smokeless tobacco: Not on file  . Alcohol Use: Not on file  . Drug Use: Not on file  . Sexual Activity: Not on file   Other Topics Concern  . Not on file   Social History Narrative    REVIEW OF SYSTEMS: Constitutional: No fevers, chills, or sweats, no generalized fatigue, change in appetite Eyes: No visual changes, double vision, eye pain Ear, nose and throat: No hearing loss, ear pain, nasal congestion, sore throat Cardiovascular: No chest pain, palpitations Respiratory:  No shortness of breath at rest or with exertion, wheezes GastrointestinaI: No nausea, vomiting, diarrhea, abdominal pain, fecal incontinence Genitourinary:  No dysuria, urinary retention or frequency Musculoskeletal:  No neck pain, back pain Integumentary: No rash, pruritus, skin lesions Neurological: as above Psychiatric: No depression, insomnia, anxiety Endocrine: No palpitations, fatigue, diaphoresis, mood swings, change in appetite, change in weight, increased thirst Hematologic/Lymphatic:  No anemia,  purpura, petechiae. Allergic/Immunologic: no itchy/runny eyes, nasal congestion, recent allergic reactions, rashes  PHYSICAL EXAM: Filed Vitals:   06/03/15 1425  BP: 134/68  Pulse: 70   General: No acute distress.  Patient appears well-groomed.  Head:  Normocephalic/atraumatic Eyes:  fundi unremarkable, without vessel changes, exudates, hemorrhages or papilledema. Neck: supple, no paraspinal tenderness, full range of motion Back: No paraspinal tenderness Heart: regular rate and  rhythm Lungs: Clear to auscultation bilaterally. Vascular: No carotid bruits. Neurological Exam: Mental status: alert and oriented to person, place, and time, recent and remote memory intact, fund of knowledge intact, attention and concentration intact, speech fluent and not dysarthric, language intact. Cranial nerves: CN I: not tested CN II: pupils equal, round and reactive to light, visual fields intact, fundi unremarkable, without vessel changes, exudates, hemorrhages or papilledema. CN III, IV, VI:  full range of motion, no nystagmus, no ptosis CN V: facial sensation intact CN VII: upper and lower face symmetric CN VIII: hearing intact CN IX, X: gag intact, uvula midline CN XI: sternocleidomastoid and trapezius muscles intact CN XII: tongue midline Bulk & Tone: normal, no fasciculations. Motor:  5/5 throughout Sensation: temperature and vibration sensation intact. Deep Tendon Reflexes:  2+ throughout, toes downgoing.  Finger to nose testing:  Without dysmetria.  Heel to shin:  Without dysmetria.  Gait:  Normal station and stride.  Able to turn and tandem walk. Romberg negative.  IMPRESSION: Intracranial arachnoid cyst.  Incidental finding, likely benign  PLAN: We will repeat MRI of brain with and without contrast to evaluate for any change If stable, no follow up needed unless there are any new clinical symptoms  Thank you for allowing me to take part in the care of this patient.  Metta Clines, DO  CC:  Anastasia Pall, MD  Roe Coombs, PA

## 2015-06-06 DIAGNOSIS — N958 Other specified menopausal and perimenopausal disorders: Secondary | ICD-10-CM | POA: Diagnosis not present

## 2015-06-06 DIAGNOSIS — Z1231 Encounter for screening mammogram for malignant neoplasm of breast: Secondary | ICD-10-CM | POA: Diagnosis not present

## 2015-06-06 DIAGNOSIS — Z01419 Encounter for gynecological examination (general) (routine) without abnormal findings: Secondary | ICD-10-CM | POA: Diagnosis not present

## 2015-06-06 DIAGNOSIS — Z6827 Body mass index (BMI) 27.0-27.9, adult: Secondary | ICD-10-CM | POA: Diagnosis not present

## 2015-06-11 ENCOUNTER — Ambulatory Visit (HOSPITAL_COMMUNITY)
Admission: RE | Admit: 2015-06-11 | Discharge: 2015-06-11 | Disposition: A | Discharge status: Home / Self Care | Payer: PPO | Source: Ambulatory Visit | Attending: Neurology | Admitting: Neurology

## 2015-06-11 ENCOUNTER — Ambulatory Visit (HOSPITAL_COMMUNITY): Payer: PPO

## 2015-06-11 DIAGNOSIS — G930 Cerebral cysts: Secondary | ICD-10-CM | POA: Diagnosis not present

## 2015-06-11 DIAGNOSIS — H748X3 Other specified disorders of middle ear and mastoid, bilateral: Secondary | ICD-10-CM | POA: Diagnosis not present

## 2015-06-11 LAB — POCT I-STAT CREATININE: CREATININE: 0.7 mg/dL (ref 0.44–1.00)

## 2015-06-11 MED ORDER — GADOBENATE DIMEGLUMINE 529 MG/ML IV SOLN
15.0000 mL | Freq: Once | INTRAVENOUS | Status: AC | PRN
  Administered 2015-06-11: 14 mL via INTRAVENOUS

## 2015-06-12 ENCOUNTER — Telehealth: Payer: Self-pay

## 2015-06-12 NOTE — Telephone Encounter (Signed)
Results were left on pt's voicemail, with instructions to call back with any questions or concerns in relation to results.   

## 2015-06-12 NOTE — Telephone Encounter (Signed)
-----   Message from Pieter Partridge, DO sent at 06/12/2015  7:27 AM EDT ----- The cyst appears stable compared to last year.  No further monitoring is necessary unless she develops new symptoms that are concerning to her.  She may follow up as needed.

## 2015-06-19 DIAGNOSIS — M20011 Mallet finger of right finger(s): Secondary | ICD-10-CM | POA: Diagnosis not present

## 2015-06-19 DIAGNOSIS — M19041 Primary osteoarthritis, right hand: Secondary | ICD-10-CM | POA: Diagnosis not present

## 2015-06-25 DIAGNOSIS — Z23 Encounter for immunization: Secondary | ICD-10-CM | POA: Diagnosis not present

## 2015-06-25 DIAGNOSIS — E782 Mixed hyperlipidemia: Secondary | ICD-10-CM | POA: Diagnosis not present

## 2015-06-27 DIAGNOSIS — C44219 Basal cell carcinoma of skin of left ear and external auricular canal: Secondary | ICD-10-CM | POA: Diagnosis not present

## 2015-08-21 DIAGNOSIS — R945 Abnormal results of liver function studies: Secondary | ICD-10-CM | POA: Diagnosis not present

## 2015-08-21 DIAGNOSIS — J329 Chronic sinusitis, unspecified: Secondary | ICD-10-CM | POA: Diagnosis not present

## 2015-08-21 DIAGNOSIS — E039 Hypothyroidism, unspecified: Secondary | ICD-10-CM | POA: Diagnosis not present

## 2015-08-21 DIAGNOSIS — R7301 Impaired fasting glucose: Secondary | ICD-10-CM | POA: Diagnosis not present

## 2015-08-21 LAB — HEPATIC FUNCTION PANEL
ALT: 23 U/L (ref 7–35)
AST: 18 U/L (ref 13–35)
Alkaline Phosphatase: 39 U/L (ref 25–125)
BILIRUBIN, TOTAL: 0.2 mg/dL

## 2015-08-21 LAB — BASIC METABOLIC PANEL
BUN: 19 mg/dL (ref 4–21)
Creatinine: 0.7 mg/dL (ref <=1.1)
POTASSIUM: 4.9 mmol/L (ref 3.4–5.3)
Sodium: 141 mmol/L (ref 137–147)

## 2015-08-21 LAB — HEMOGLOBIN A1C: Hemoglobin A1C: 6.1

## 2015-08-26 DIAGNOSIS — N951 Menopausal and female climacteric states: Secondary | ICD-10-CM | POA: Diagnosis not present

## 2015-09-09 DIAGNOSIS — C44319 Basal cell carcinoma of skin of other parts of face: Secondary | ICD-10-CM | POA: Diagnosis not present

## 2015-09-16 DIAGNOSIS — C44519 Basal cell carcinoma of skin of other part of trunk: Secondary | ICD-10-CM | POA: Diagnosis not present

## 2015-09-16 DIAGNOSIS — D485 Neoplasm of uncertain behavior of skin: Secondary | ICD-10-CM | POA: Diagnosis not present

## 2015-10-09 DIAGNOSIS — D485 Neoplasm of uncertain behavior of skin: Secondary | ICD-10-CM | POA: Diagnosis not present

## 2015-10-09 DIAGNOSIS — L821 Other seborrheic keratosis: Secondary | ICD-10-CM | POA: Diagnosis not present

## 2015-10-09 DIAGNOSIS — D1801 Hemangioma of skin and subcutaneous tissue: Secondary | ICD-10-CM | POA: Diagnosis not present

## 2015-10-09 DIAGNOSIS — Z85828 Personal history of other malignant neoplasm of skin: Secondary | ICD-10-CM | POA: Diagnosis not present

## 2015-10-09 DIAGNOSIS — L814 Other melanin hyperpigmentation: Secondary | ICD-10-CM | POA: Diagnosis not present

## 2015-10-09 DIAGNOSIS — L570 Actinic keratosis: Secondary | ICD-10-CM | POA: Diagnosis not present

## 2015-10-10 DIAGNOSIS — C44619 Basal cell carcinoma of skin of left upper limb, including shoulder: Secondary | ICD-10-CM | POA: Diagnosis not present

## 2015-10-10 DIAGNOSIS — C44612 Basal cell carcinoma of skin of right upper limb, including shoulder: Secondary | ICD-10-CM | POA: Diagnosis not present

## 2015-10-10 DIAGNOSIS — C44519 Basal cell carcinoma of skin of other part of trunk: Secondary | ICD-10-CM | POA: Diagnosis not present

## 2015-10-10 DIAGNOSIS — D045 Carcinoma in situ of skin of trunk: Secondary | ICD-10-CM | POA: Diagnosis not present

## 2015-10-14 DIAGNOSIS — C44519 Basal cell carcinoma of skin of other part of trunk: Secondary | ICD-10-CM | POA: Diagnosis not present

## 2015-11-19 DIAGNOSIS — E782 Mixed hyperlipidemia: Secondary | ICD-10-CM | POA: Diagnosis not present

## 2015-11-19 DIAGNOSIS — Z0000 Encounter for general adult medical examination without abnormal findings: Secondary | ICD-10-CM | POA: Diagnosis not present

## 2015-11-19 DIAGNOSIS — R000 Tachycardia, unspecified: Secondary | ICD-10-CM | POA: Diagnosis not present

## 2015-11-19 DIAGNOSIS — R7301 Impaired fasting glucose: Secondary | ICD-10-CM | POA: Diagnosis not present

## 2015-11-26 DIAGNOSIS — E663 Overweight: Secondary | ICD-10-CM | POA: Diagnosis not present

## 2015-11-26 DIAGNOSIS — E785 Hyperlipidemia, unspecified: Secondary | ICD-10-CM | POA: Diagnosis not present

## 2015-11-26 DIAGNOSIS — R002 Palpitations: Secondary | ICD-10-CM | POA: Diagnosis not present

## 2015-11-26 DIAGNOSIS — R000 Tachycardia, unspecified: Secondary | ICD-10-CM | POA: Diagnosis not present

## 2015-11-26 DIAGNOSIS — Z8249 Family history of ischemic heart disease and other diseases of the circulatory system: Secondary | ICD-10-CM | POA: Diagnosis not present

## 2015-12-09 DIAGNOSIS — R008 Other abnormalities of heart beat: Secondary | ICD-10-CM | POA: Diagnosis not present

## 2015-12-10 DIAGNOSIS — R079 Chest pain, unspecified: Secondary | ICD-10-CM | POA: Diagnosis not present

## 2015-12-16 DIAGNOSIS — D485 Neoplasm of uncertain behavior of skin: Secondary | ICD-10-CM | POA: Diagnosis not present

## 2015-12-16 DIAGNOSIS — C44321 Squamous cell carcinoma of skin of nose: Secondary | ICD-10-CM | POA: Diagnosis not present

## 2015-12-16 DIAGNOSIS — C44519 Basal cell carcinoma of skin of other part of trunk: Secondary | ICD-10-CM | POA: Diagnosis not present

## 2015-12-16 DIAGNOSIS — C44612 Basal cell carcinoma of skin of right upper limb, including shoulder: Secondary | ICD-10-CM | POA: Diagnosis not present

## 2016-02-03 DIAGNOSIS — L570 Actinic keratosis: Secondary | ICD-10-CM | POA: Diagnosis not present

## 2016-02-03 DIAGNOSIS — C44321 Squamous cell carcinoma of skin of nose: Secondary | ICD-10-CM | POA: Diagnosis not present

## 2016-02-24 DIAGNOSIS — J069 Acute upper respiratory infection, unspecified: Secondary | ICD-10-CM | POA: Diagnosis not present

## 2016-03-03 DIAGNOSIS — R5381 Other malaise: Secondary | ICD-10-CM | POA: Diagnosis not present

## 2016-03-03 DIAGNOSIS — J101 Influenza due to other identified influenza virus with other respiratory manifestations: Secondary | ICD-10-CM | POA: Diagnosis not present

## 2016-03-27 DIAGNOSIS — L570 Actinic keratosis: Secondary | ICD-10-CM | POA: Diagnosis not present

## 2016-04-21 DIAGNOSIS — L570 Actinic keratosis: Secondary | ICD-10-CM | POA: Diagnosis not present

## 2016-04-22 ENCOUNTER — Other Ambulatory Visit: Payer: Self-pay | Admitting: Endocrinology

## 2016-04-22 DIAGNOSIS — E049 Nontoxic goiter, unspecified: Secondary | ICD-10-CM

## 2016-04-28 ENCOUNTER — Encounter: Payer: Self-pay | Admitting: General Practice

## 2016-04-30 ENCOUNTER — Telehealth: Payer: Self-pay | Admitting: Family Medicine

## 2016-04-30 ENCOUNTER — Other Ambulatory Visit: Payer: Self-pay | Admitting: Family Medicine

## 2016-04-30 DIAGNOSIS — E785 Hyperlipidemia, unspecified: Secondary | ICD-10-CM

## 2016-04-30 MED ORDER — ATORVASTATIN CALCIUM 20 MG PO TABS: 20.0000 mg | ORAL_TABLET | Freq: Every day | ORAL | 0 refills | Status: DC

## 2016-04-30 NOTE — Telephone Encounter (Signed)
Please advise, pt does not establish with our office until 07/17/2016. You have not seen her for any appointments.

## 2016-04-30 NOTE — Telephone Encounter (Signed)
LMOVM to return call.

## 2016-04-30 NOTE — Addendum Note (Signed)
Addended by: Davis Gourd on: 04/30/2016 04:34 PM   Modules accepted: Orders

## 2016-04-30 NOTE — Telephone Encounter (Signed)
Patient's husband called to get atorvastatin (LIPITOR) 20 MG tablet refilled for wife. He claims that Dr. Melford Aase (prior PCP) office will not fill the request and Lala Lund spoke with him and said that [facility] would fill the request. Please call husband's mobile to advise.   Thank you.

## 2016-04-30 NOTE — Telephone Encounter (Signed)
Patient called back. Scheduled lab appt for tomorrow. Verified pharmacy is [facility]

## 2016-04-30 NOTE — Telephone Encounter (Signed)
Noted, labs ordered and med filled.

## 2016-04-30 NOTE — Telephone Encounter (Signed)
It is not our usual practice to fill medications without seeing the patients.  I will give pt 30 pills of lipitor but she needs to schedule a lab visit prior to her new pt appt for lipid panel, LFTs, BMP as we don't have any results or notes since Oct 2016.

## 2016-05-01 ENCOUNTER — Other Ambulatory Visit (INDEPENDENT_AMBULATORY_CARE_PROVIDER_SITE_OTHER): Payer: PPO

## 2016-05-01 DIAGNOSIS — E785 Hyperlipidemia, unspecified: Secondary | ICD-10-CM

## 2016-05-01 LAB — LIPID PANEL
CHOL/HDL RATIO: 3
CHOLESTEROL: 164 mg/dL (ref 0–200)
HDL: 58.7 mg/dL (ref >39.00)
LDL CALC: 75 mg/dL (ref 0–99)
NonHDL: 105.36
TRIGLYCERIDES: 152 mg/dL — AB (ref 0.0–149.0)
VLDL: 30.4 mg/dL (ref 0.0–40.0)

## 2016-05-01 LAB — HEPATIC FUNCTION PANEL
ALBUMIN: 4.3 g/dL (ref 3.5–5.2)
ALK PHOS: 33 U/L — AB (ref 39–117)
ALT: 28 U/L (ref 0–35)
AST: 16 U/L (ref 0–37)
Bilirubin, Direct: 0.1 mg/dL (ref 0.0–0.3)
TOTAL PROTEIN: 6.6 g/dL (ref 6.0–8.3)
Total Bilirubin: 0.4 mg/dL (ref 0.2–1.2)

## 2016-05-01 NOTE — Telephone Encounter (Signed)
I do not recall telling any pt that Dr. Birdie Riddle would fill any Rx prior to a NP appt. I know that is not my decision to make.

## 2016-05-13 ENCOUNTER — Encounter: Payer: Self-pay | Admitting: *Deleted

## 2016-05-25 ENCOUNTER — Other Ambulatory Visit: Payer: PPO

## 2016-05-25 ENCOUNTER — Other Ambulatory Visit: Payer: Self-pay | Admitting: Family Medicine

## 2016-05-25 DIAGNOSIS — E785 Hyperlipidemia, unspecified: Secondary | ICD-10-CM

## 2016-05-25 DIAGNOSIS — E039 Hypothyroidism, unspecified: Secondary | ICD-10-CM | POA: Diagnosis not present

## 2016-05-27 ENCOUNTER — Encounter: Payer: Self-pay | Admitting: Physician Assistant

## 2016-05-27 ENCOUNTER — Ambulatory Visit (INDEPENDENT_AMBULATORY_CARE_PROVIDER_SITE_OTHER): Payer: PPO | Admitting: Physician Assistant

## 2016-05-27 VITALS — BP 118/76 | HR 97 | Temp 99.2°F | Resp 14 | Ht 62.0 in | Wt 160.0 lb

## 2016-05-27 DIAGNOSIS — J329 Chronic sinusitis, unspecified: Secondary | ICD-10-CM

## 2016-05-27 DIAGNOSIS — B9789 Other viral agents as the cause of diseases classified elsewhere: Secondary | ICD-10-CM | POA: Diagnosis not present

## 2016-05-27 MED ORDER — BENZONATATE 100 MG PO CAPS: 100.0000 mg | ORAL_CAPSULE | Freq: Three times a day (TID) | ORAL | 0 refills | Status: DC | PRN

## 2016-05-27 MED ORDER — AMOXICILLIN-POT CLAVULANATE 875-125 MG PO TABS: 1.0000 | ORAL_TABLET | Freq: Two times a day (BID) | ORAL | 0 refills | Status: DC

## 2016-05-27 MED ORDER — FLUTICASONE PROPIONATE 50 MCG/ACT NA SUSP: 2.0000 | Freq: Every day | NASAL | 6 refills | Status: DC

## 2016-05-27 NOTE — Progress Notes (Signed)
Pre visit review using our clinic review tool, if applicable. No additional management support is needed unless otherwise documented below in the visit note. 

## 2016-05-27 NOTE — Progress Notes (Signed)
Patient presents to clinic today c/o 3 days of worsening dry cough with PND, chills, fatigue, sinus pressure and sinus headache. Today with maxillary sinus pain and pressure. Endorses thick drainage. Denies fever, chest pain or SOB. Has taken Robitussin with little relief of symptoms.   Past Medical History:  Diagnosis Date  . Arachnoid cyst   . Arthritis   . Basal cell carcinoma   . Hyperlipidemia   . Hypertension   . Thyroid goiter     Current Outpatient Prescriptions on File Prior to Visit  Medication Sig Dispense Refill  . aspirin 81 MG tablet 81 mg.    . atorvastatin (LIPITOR) 20 MG tablet Take 1 tablet (20 mg total) by mouth daily at 6 PM. 30 tablet 0  . BIOTIN PO Take by mouth.    . Calcium Carbonate-Vit D-Min (CALCIUM 1200 PO) Take by mouth.    . CETIRIZINE HCL PO Take 1 tablet by mouth at bedtime as needed.     . diclofenac (VOLTAREN) 75 MG EC tablet Take 75 mg by mouth every morning.     . FENOFIBRATE PO Take 160 mg by mouth daily.     Marland Kitchen levothyroxine (SYNTHROID, LEVOTHROID) 75 MCG tablet Take 75 mcg by mouth.    . Omega-3 Fatty Acids (FISH OIL) 1000 MG CAPS Take by mouth.    . vitamin E (VITAMIN E) 400 UNIT capsule 400 Units.    . Multiple Vitamins-Minerals (MULTIVITAMIN PO) Take by mouth.     No current facility-administered medications on file prior to visit.     Allergies  Allergen Reactions  . Pseudoephedrine     Other reaction(s): Other Elevated BP  . Rosuvastatin     Other reaction(s): Other headache    Family History  Problem Relation Age of Onset  . Thyroid disease Mother   . Atrial fibrillation Mother   . Fibromyalgia Mother   . Arthritis Mother   . Emphysema Father   . Diabetes Father   . Heart disease Father   . Cancer Father     gall bladder    Social History   Social History  . Marital status: Married    Spouse name: N/A  . Number of children: N/A  . Years of education: N/A   Social History Main Topics  . Smoking status: Never  Smoker  . Smokeless tobacco: Never Used  . Alcohol use No  . Drug use: No  . Sexual activity: Not Asked   Other Topics Concern  . None   Social History Narrative  . None    Review of Systems - See HPI.  All other ROS are negative.  BP 118/76   Pulse 97   Temp 99.2 F (37.3 C) (Oral)   Resp 14   Ht 5\' 2"  (1.575 m)   Wt 160 lb (72.6 kg)   SpO2 95%   BMI 29.26 kg/m   Physical Exam  Constitutional: She is oriented to person, place, and time and well-developed, well-nourished, and in no distress.  HENT:  Head: Normocephalic and atraumatic.  Right Ear: Tympanic membrane normal.  Left Ear: Tympanic membrane normal.  Nose: Mucosal edema and rhinorrhea present. Right sinus exhibits maxillary sinus tenderness. Left sinus exhibits maxillary sinus tenderness.  Mouth/Throat: Uvula is midline, oropharynx is clear and moist and mucous membranes are normal.  Eyes: Conjunctivae are normal.  Neck: Neck supple.  Cardiovascular: Normal rate, regular rhythm, normal heart sounds and intact distal pulses.   Pulmonary/Chest: Effort normal and breath sounds normal.  No respiratory distress. She has no wheezes. She has no rales. She exhibits no tenderness.  Lymphadenopathy:    She has no cervical adenopathy.  Neurological: She is alert and oriented to person, place, and time.  Skin: Skin is warm and dry. No rash noted.  Psychiatric: Affect normal.  Vitals reviewed.   Recent Results (from the past 2160 hour(s))  Lipid panel     Status: Abnormal   Collection Time: 05/01/16 10:38 AM  Result Value Ref Range   Cholesterol 164 0 - 200 mg/dL    Comment: ATP III Classification       Desirable:  < 200 mg/dL               Borderline High:  200 - 239 mg/dL          High:  > = 240 mg/dL   Triglycerides 152.0 (H) 0.0 - 149.0 mg/dL    Comment: Normal:  <150 mg/dLBorderline High:  150 - 199 mg/dL   HDL 58.70 >39.00 mg/dL   VLDL 30.4 0.0 - 40.0 mg/dL   LDL Cholesterol 75 0 - 99 mg/dL   Total CHOL/HDL  Ratio 3     Comment:                Men          Women1/2 Average Risk     3.4          3.3Average Risk          5.0          4.42X Average Risk          9.6          7.13X Average Risk          15.0          11.0                       NonHDL 105.36     Comment: NOTE:  Non-HDL goal should be 30 mg/dL higher than patient's LDL goal (i.e. LDL goal of < 70 mg/dL, would have non-HDL goal of < 100 mg/dL)  Hepatic function panel     Status: Abnormal   Collection Time: 05/01/16 10:38 AM  Result Value Ref Range   Total Bilirubin 0.4 0.2 - 1.2 mg/dL   Bilirubin, Direct 0.1 0.0 - 0.3 mg/dL   Alkaline Phosphatase 33 (L) 39 - 117 U/L   AST 16 0 - 37 U/L   ALT 28 0 - 35 U/L   Total Protein 6.6 6.0 - 8.3 g/dL   Albumin 4.3 3.5 - 5.2 g/dL   Assessment/Plan: 1. Viral sinusitis Likely viral sinusitis although with TTP starting today (Day 3 of symptoms). Could potentially be bacterial. Reviewed supportive measures and OTC medications with patient. Rx Tessalon for cough. Flonase as directed. ABX printed. Patient to start if no improvement with 48 hours or if anything worsens.  - benzonatate (TESSALON) 100 MG capsule; Take 1 capsule (100 mg total) by mouth 3 (three) times daily as needed for cough.  Dispense: 30 capsule; Refill: 0   Leeanne Rio, [place]

## 2016-05-27 NOTE — Patient Instructions (Signed)
Increase fluid intake.  Use Saline nasal spray.  Take a daily multivitamin. Use Flonase and Tessalon as directed.  Place a humidifier in the bedroom.  Please call or return clinic if symptoms are not improving.  Sinusitis Sinusitis is redness, soreness, and swelling (inflammation) of the paranasal sinuses. Paranasal sinuses are air pockets within the bones of your face (beneath the eyes, the middle of the forehead, or above the eyes). In healthy paranasal sinuses, mucus is able to drain out, and air is able to circulate through them by way of your nose. However, when your paranasal sinuses are inflamed, mucus and air can become trapped. This can allow bacteria and other germs to grow and cause infection. Sinusitis can develop quickly and last only a short time (acute) or continue over a long period (chronic). Sinusitis that lasts for more than 12 weeks is considered chronic.  CAUSES  Causes of sinusitis include:  Allergies.  Structural abnormalities, such as displacement of the cartilage that separates your nostrils (deviated septum), which can decrease the air flow through your nose and sinuses and affect sinus drainage.  Functional abnormalities, such as when the small hairs (cilia) that line your sinuses and help remove mucus do not work properly or are not present. SYMPTOMS  Symptoms of acute and chronic sinusitis are the same. The primary symptoms are pain and pressure around the affected sinuses. Other symptoms include:  Upper toothache.  Earache.  Headache.  Bad breath.  Decreased sense of smell and taste.  A cough, which worsens when you are lying flat.  Fatigue.  Fever.  Thick drainage from your nose, which often is green and may contain pus (purulent).  Swelling and warmth over the affected sinuses. DIAGNOSIS  Your caregiver will perform a physical exam. During the exam, your caregiver may:  Look in your nose for signs of abnormal growths in your nostrils (nasal  polyps).  Tap over the affected sinus to check for signs of infection.  View the inside of your sinuses (endoscopy) with a special imaging device with a light attached (endoscope), which is inserted into your sinuses. If your caregiver suspects that you have chronic sinusitis, one or more of the following tests may be recommended:  Allergy tests.  Nasal culture A sample of mucus is taken from your nose and sent to a lab and screened for bacteria.  Nasal cytology A sample of mucus is taken from your nose and examined by your caregiver to determine if your sinusitis is related to an allergy. TREATMENT  Most cases of acute sinusitis are related to a viral infection and will resolve on their own within 10 days. Sometimes medicines are prescribed to help relieve symptoms (pain medicine, decongestants, nasal steroid sprays, or saline sprays).  However, for sinusitis related to a bacterial infection, your caregiver will prescribe antibiotic medicines. These are medicines that will help kill the bacteria causing the infection.  Rarely, sinusitis is caused by a fungal infection. In theses cases, your caregiver will prescribe antifungal medicine. For some cases of chronic sinusitis, surgery is needed. Generally, these are cases in which sinusitis recurs more than 3 times per year, despite other treatments. HOME CARE INSTRUCTIONS   Drink plenty of water. Water helps thin the mucus so your sinuses can drain more easily.  Use a humidifier.  Inhale steam 3 to 4 times a day (for example, sit in the bathroom with the shower running).  Apply a warm, moist washcloth to your face 3 to 4 times a day,  or as directed by your caregiver.  Use saline nasal sprays to help moisten and clean your sinuses.  Take over-the-counter or prescription medicines for pain, discomfort, or fever only as directed by your caregiver. SEEK IMMEDIATE MEDICAL CARE IF:  You have increasing pain or severe headaches.  You have  nausea, vomiting, or drowsiness.  You have swelling around your face.  You have vision problems.  You have a stiff neck.  You have difficulty breathing. MAKE SURE YOU:   Understand these instructions.  Will watch your condition.  Will get help right away if you are not doing well or get worse. Document Released: 03/09/2005 Document Revised: 06/01/2011 Document Reviewed: 03/24/2011 [facility] Patient Information 2014 [place], [place].

## 2016-06-02 ENCOUNTER — Other Ambulatory Visit: Payer: Self-pay | Admitting: Family Medicine

## 2016-06-02 DIAGNOSIS — E785 Hyperlipidemia, unspecified: Secondary | ICD-10-CM

## 2016-06-02 NOTE — Telephone Encounter (Signed)
Please advise, this is the new pt that you have not seen yet?

## 2016-06-08 DIAGNOSIS — Z124 Encounter for screening for malignant neoplasm of cervix: Secondary | ICD-10-CM | POA: Diagnosis not present

## 2016-06-08 DIAGNOSIS — N760 Acute vaginitis: Secondary | ICD-10-CM | POA: Diagnosis not present

## 2016-06-08 DIAGNOSIS — Z1231 Encounter for screening mammogram for malignant neoplasm of breast: Secondary | ICD-10-CM | POA: Diagnosis not present

## 2016-06-08 DIAGNOSIS — Z6829 Body mass index (BMI) 29.0-29.9, adult: Secondary | ICD-10-CM | POA: Diagnosis not present

## 2016-06-08 DIAGNOSIS — Z01419 Encounter for gynecological examination (general) (routine) without abnormal findings: Secondary | ICD-10-CM | POA: Diagnosis not present

## 2016-06-16 LAB — HM MAMMOGRAPHY: HM Mammogram: NORMAL (ref 0–4)

## 2016-06-16 LAB — HM PAP SMEAR

## 2016-06-18 DIAGNOSIS — E039 Hypothyroidism, unspecified: Secondary | ICD-10-CM | POA: Diagnosis not present

## 2016-06-18 DIAGNOSIS — E049 Nontoxic goiter, unspecified: Secondary | ICD-10-CM | POA: Diagnosis not present

## 2016-06-18 DIAGNOSIS — I10 Essential (primary) hypertension: Secondary | ICD-10-CM | POA: Diagnosis not present

## 2016-06-26 ENCOUNTER — Ambulatory Visit
Admission: RE | Admit: 2016-06-26 | Discharge: 2016-06-26 | Disposition: A | Discharge status: Home / Self Care | Payer: PPO | Source: Ambulatory Visit | Attending: Endocrinology | Admitting: Endocrinology

## 2016-06-26 DIAGNOSIS — E049 Nontoxic goiter, unspecified: Secondary | ICD-10-CM

## 2016-07-06 ENCOUNTER — Other Ambulatory Visit: Payer: Self-pay | Admitting: Family Medicine

## 2016-07-06 DIAGNOSIS — E785 Hyperlipidemia, unspecified: Secondary | ICD-10-CM

## 2016-07-12 ENCOUNTER — Encounter: Payer: Self-pay | Admitting: Physician Assistant

## 2016-07-17 ENCOUNTER — Ambulatory Visit (INDEPENDENT_AMBULATORY_CARE_PROVIDER_SITE_OTHER): Payer: PPO | Admitting: Family Medicine

## 2016-07-17 ENCOUNTER — Encounter: Payer: Self-pay | Admitting: Family Medicine

## 2016-07-17 VITALS — BP 124/74 | HR 75 | Temp 98.1°F | Resp 16 | Ht 62.0 in | Wt 162.0 lb

## 2016-07-17 DIAGNOSIS — J301 Allergic rhinitis due to pollen: Secondary | ICD-10-CM | POA: Diagnosis not present

## 2016-07-17 DIAGNOSIS — R600 Localized edema: Secondary | ICD-10-CM

## 2016-07-17 DIAGNOSIS — K219 Gastro-esophageal reflux disease without esophagitis: Secondary | ICD-10-CM | POA: Diagnosis not present

## 2016-07-17 DIAGNOSIS — E039 Hypothyroidism, unspecified: Secondary | ICD-10-CM | POA: Diagnosis not present

## 2016-07-17 DIAGNOSIS — E785 Hyperlipidemia, unspecified: Secondary | ICD-10-CM | POA: Diagnosis not present

## 2016-07-17 DIAGNOSIS — J309 Allergic rhinitis, unspecified: Secondary | ICD-10-CM | POA: Insufficient documentation

## 2016-07-17 MED ORDER — OMEPRAZOLE 20 MG PO CPDR: 20.0000 mg | DELAYED_RELEASE_CAPSULE | Freq: Every day | ORAL | 3 refills | Status: DC

## 2016-07-17 MED ORDER — DICLOFENAC SODIUM 1 % TD GEL: 4.0000 g | Freq: Four times a day (QID) | TRANSDERMAL | 3 refills | Status: AC

## 2016-07-17 MED ORDER — FUROSEMIDE 20 MG PO TABS: 20.0000 mg | ORAL_TABLET | Freq: Every day | ORAL | 3 refills | Status: DC

## 2016-07-17 MED ORDER — ALBUTEROL SULFATE HFA 108 (90 BASE) MCG/ACT IN AERS: 2.0000 | INHALATION_SPRAY | Freq: Four times a day (QID) | RESPIRATORY_TRACT | 2 refills | Status: DC | PRN

## 2016-07-17 MED ORDER — AZITHROMYCIN 250 MG PO TABS: ORAL_TABLET | ORAL | 0 refills | Status: DC

## 2016-07-17 NOTE — Progress Notes (Signed)
   Subjective:    Patient ID: Paige Jones, female    DOB: May 04, 1950, 66 y.o.   MRN: [id number]  HPI New to establish.  Previous MD- [facility], Frederico Hamman)  Welcome  Hyperlipidemia- chronic problem, on Lipitor and Fenofibrate.  Most recent lipid panel done 05/01/16 showed excellent control w/ LDL 75, Triglycerides 152.  No CP, SOB, abd pain, N/V, edema.  Pt is trying to exercise more regularly.  Hypothyroid- chronic problem, seeing Dr Chalmers Cater.  Currently on Levothyroxine.  Most recent TSH 0.7  Had recent thyroid US as they are following 2 nodules.  Allergic rhinitis- pt was tx'd for sinus infection in early March and sxs resolved until ~10 days ago.  Cough is now productive w/ some chest tightness.  + HA- 'a real tight cap on my head'.  Some sensitivity to light.  + PND, nasal congestion.  Using Flonase, Zyrtec daily.  Edema- pt reports swelling of hands/feet.  Particularly bad in the warm weather.  Reports low Na.  Exercising.  Reports good water intake.  Denies SOB.  GERD- pt has been taking husband's Omeprazole.  Reports she will have sxs after almost every meal if not taking medication.     Review of Systems For ROS see HPI     Objective:   Physical Exam  Constitutional: She is oriented to person, place, and time. She appears well-developed and well-nourished. No distress.  HENT:  Head: Normocephalic and atraumatic.  Right Ear: Tympanic membrane normal.  Left Ear: Tympanic membrane normal.  Nose: Mucosal edema and rhinorrhea present.  Mouth/Throat: Mucous membranes are normal. Posterior oropharyngeal erythema (w/ PND) present.  Mild TTP over sinuses  Eyes: Conjunctivae and EOM are normal. Pupils are equal, round, and reactive to light.  Neck: Normal range of motion. Neck supple.  Cardiovascular: Normal rate, regular rhythm and normal heart sounds.   Pulmonary/Chest: Effort normal and breath sounds normal. No respiratory distress. She has no wheezes. She has no  rales.  + hacking cough  Lymphadenopathy:    She has no cervical adenopathy.  Neurological: She is alert and oriented to person, place, and time.  Vitals reviewed.         Assessment & Plan:

## 2016-07-17 NOTE — Assessment & Plan Note (Signed)
New.  Pt has been taking husband's PPI w/ good relief.  Will provider her a prescription for Omeprazole.  Reviewed lifestyle and dietary modifications.  Will follow.

## 2016-07-17 NOTE — Assessment & Plan Note (Signed)
New to provider, ongoing for pt.  Following w/ Dr Chalmers Cater.  Recent TSH WNL.  Will follow along and assist as able.

## 2016-07-17 NOTE — Assessment & Plan Note (Signed)
New.  Pt reports she has recurrent swelling of hands/feet despite low Na diet, good water intake.  sxs are worse in warm weather.  Will start Lasix prn.  Reviewed supportive care and red flags that should prompt return.  Pt expressed understanding and is in agreement w/ plan.

## 2016-07-17 NOTE — Progress Notes (Signed)
Pre visit review using our clinic review tool, if applicable. No additional management support is needed unless otherwise documented below in the visit note. 

## 2016-07-17 NOTE — Patient Instructions (Signed)
Schedule a lab visit in 1 month to recheck potassium Schedule your complete physical in 6 months and your Wellness Visit with Maudie Mercury around the same time Start the [place] as directed for the bronchitis/sinusitis Use the Albuterol inhaler as needed for cough/shortness of breath Mucinex DM for cough/congestion Drink plenty of fluids Start the Lasix (furosemide) once daily as needed for swelling Start the Omeprazole daily for heartburn Call with any questions or concerns Hang in there! Welcome!  We're glad to have you!

## 2016-07-17 NOTE — Assessment & Plan Note (Signed)
New to provider, ongoing for pt.  On Lipitor and fenofibrate.  Recent labs showed good control.  Asymptomatic.  No med changes at this time.  Will follow.

## 2016-07-17 NOTE — Assessment & Plan Note (Signed)
Chronic problem.  Using Flonase and Zyrtec daily.  Does show signs of sinus inflammation and allergic bronchitis.  Start Zpack as pt has completed her course of Augmentin nearly 2 months ago.  Start Albuterol PRN.  Cough meds prn.  Reviewed supportive care and red flags that should prompt return.  Pt expressed understanding and is in agreement w/ plan.

## 2016-08-03 ENCOUNTER — Other Ambulatory Visit: Payer: Self-pay | Admitting: Family Medicine

## 2016-08-03 DIAGNOSIS — E785 Hyperlipidemia, unspecified: Secondary | ICD-10-CM

## 2016-08-14 ENCOUNTER — Other Ambulatory Visit (INDEPENDENT_AMBULATORY_CARE_PROVIDER_SITE_OTHER): Payer: PPO

## 2016-08-14 DIAGNOSIS — R600 Localized edema: Secondary | ICD-10-CM | POA: Diagnosis not present

## 2016-08-14 LAB — BASIC METABOLIC PANEL
BUN: 21 mg/dL (ref 6–23)
CALCIUM: 9.7 mg/dL (ref 8.4–10.5)
CO2: 29 meq/L (ref 19–32)
CREATININE: 0.81 mg/dL (ref 0.40–1.20)
Chloride: 104 mEq/L (ref 96–112)
GFR: 75.13 mL/min (ref >60.00)
GLUCOSE: 105 mg/dL — AB (ref 70–99)
Potassium: 4.3 mEq/L (ref 3.5–5.1)
Sodium: 138 mEq/L (ref 135–145)

## 2016-08-26 ENCOUNTER — Other Ambulatory Visit: Payer: Self-pay | Admitting: Family Medicine

## 2016-09-08 ENCOUNTER — Other Ambulatory Visit: Payer: Self-pay | Admitting: Family Medicine

## 2016-09-09 ENCOUNTER — Encounter: Payer: Self-pay | Admitting: Family Medicine

## 2016-09-09 MED ORDER — DICLOFENAC SODIUM 75 MG PO TBEC: DELAYED_RELEASE_TABLET | ORAL | 1 refills | Status: DC

## 2016-10-28 ENCOUNTER — Other Ambulatory Visit: Payer: Self-pay | Admitting: Family Medicine

## 2016-11-17 DIAGNOSIS — D1801 Hemangioma of skin and subcutaneous tissue: Secondary | ICD-10-CM | POA: Diagnosis not present

## 2016-11-17 DIAGNOSIS — D485 Neoplasm of uncertain behavior of skin: Secondary | ICD-10-CM | POA: Diagnosis not present

## 2016-11-17 DIAGNOSIS — L821 Other seborrheic keratosis: Secondary | ICD-10-CM | POA: Diagnosis not present

## 2016-11-17 DIAGNOSIS — L570 Actinic keratosis: Secondary | ICD-10-CM | POA: Diagnosis not present

## 2016-11-17 DIAGNOSIS — L718 Other rosacea: Secondary | ICD-10-CM | POA: Diagnosis not present

## 2016-11-17 DIAGNOSIS — L439 Lichen planus, unspecified: Secondary | ICD-10-CM | POA: Diagnosis not present

## 2016-11-17 DIAGNOSIS — L814 Other melanin hyperpigmentation: Secondary | ICD-10-CM | POA: Diagnosis not present

## 2016-11-17 DIAGNOSIS — Z85828 Personal history of other malignant neoplasm of skin: Secondary | ICD-10-CM | POA: Diagnosis not present

## 2016-12-01 DIAGNOSIS — L570 Actinic keratosis: Secondary | ICD-10-CM | POA: Diagnosis not present

## 2016-12-01 DIAGNOSIS — L718 Other rosacea: Secondary | ICD-10-CM | POA: Diagnosis not present

## 2016-12-01 DIAGNOSIS — L299 Pruritus, unspecified: Secondary | ICD-10-CM | POA: Diagnosis not present

## 2016-12-23 DIAGNOSIS — H2513 Age-related nuclear cataract, bilateral: Secondary | ICD-10-CM | POA: Diagnosis not present

## 2016-12-27 ENCOUNTER — Encounter (HOSPITAL_COMMUNITY): Payer: Self-pay | Admitting: *Deleted

## 2016-12-27 ENCOUNTER — Emergency Department (HOSPITAL_COMMUNITY)
Admission: EM | Admit: 2016-12-27 | Discharge: 2016-12-28 | Disposition: A | Discharge status: Home / Self Care | Payer: PPO | Attending: Emergency Medicine | Admitting: Emergency Medicine

## 2016-12-27 ENCOUNTER — Emergency Department (HOSPITAL_COMMUNITY): Payer: PPO

## 2016-12-27 DIAGNOSIS — Z79899 Other long term (current) drug therapy: Secondary | ICD-10-CM | POA: Insufficient documentation

## 2016-12-27 DIAGNOSIS — I10 Essential (primary) hypertension: Secondary | ICD-10-CM | POA: Insufficient documentation

## 2016-12-27 DIAGNOSIS — I4891 Unspecified atrial fibrillation: Secondary | ICD-10-CM | POA: Diagnosis not present

## 2016-12-27 DIAGNOSIS — E785 Hyperlipidemia, unspecified: Secondary | ICD-10-CM | POA: Insufficient documentation

## 2016-12-27 DIAGNOSIS — I480 Paroxysmal atrial fibrillation: Secondary | ICD-10-CM | POA: Diagnosis not present

## 2016-12-27 DIAGNOSIS — Z7982 Long term (current) use of aspirin: Secondary | ICD-10-CM | POA: Insufficient documentation

## 2016-12-27 DIAGNOSIS — R002 Palpitations: Secondary | ICD-10-CM | POA: Diagnosis not present

## 2016-12-27 DIAGNOSIS — R000 Tachycardia, unspecified: Secondary | ICD-10-CM | POA: Diagnosis not present

## 2016-12-27 NOTE — ED Triage Notes (Signed)
Pt brought in by [facility] for c/o palpitations; pt states she was sitting on the couch when the episode began; pt denies any chest pain

## 2016-12-28 DIAGNOSIS — R002 Palpitations: Secondary | ICD-10-CM | POA: Diagnosis not present

## 2016-12-28 LAB — BASIC METABOLIC PANEL
ANION GAP: 11 (ref 5–15)
BUN: 14 mg/dL (ref 6–20)
CHLORIDE: 109 mmol/L (ref 101–111)
CO2: 23 mmol/L (ref 22–32)
CREATININE: 0.74 mg/dL (ref 0.44–1.00)
Calcium: 10 mg/dL (ref 8.9–10.3)
Glucose, Bld: 152 mg/dL — ABNORMAL HIGH (ref 65–99)
POTASSIUM: 3.2 mmol/L — AB (ref 3.5–5.1)
SODIUM: 143 mmol/L (ref 135–145)

## 2016-12-28 LAB — CBC
HCT: 40.3 % (ref 36.0–46.0)
Hemoglobin: 13.7 g/dL (ref 12.0–15.0)
MCH: 30 pg (ref 26.0–34.0)
MCHC: 34 g/dL (ref 30.0–36.0)
MCV: 88.4 fL (ref 78.0–100.0)
PLATELETS: 234 10*3/uL (ref 150–400)
RBC: 4.56 MIL/uL (ref 3.87–5.11)
RDW: 13.7 % (ref 11.5–15.5)
WBC: 11 10*3/uL — AB (ref 4.0–10.5)

## 2016-12-28 LAB — I-STAT TROPONIN, ED: Troponin i, poc: 0 ng/mL (ref 0.00–0.08)

## 2016-12-28 LAB — TSH: TSH: 3.987 u[IU]/mL (ref 0.350–4.500)

## 2016-12-28 MED ORDER — RIVAROXABAN 20 MG PO TABS: 20.0000 mg | ORAL_TABLET | Freq: Every day | ORAL | 0 refills | Status: DC

## 2016-12-28 MED ORDER — DILTIAZEM HCL-DEXTROSE 100-5 MG/100ML-% IV SOLN (PREMIX)
5.0000 mg/h | Freq: Once | INTRAVENOUS | Status: AC
  Administered 2016-12-28: 5 mg/h via INTRAVENOUS
  Filled 2016-12-28: qty 100

## 2016-12-28 MED ORDER — SODIUM CHLORIDE 0.9 % IV BOLUS (SEPSIS)
500.0000 mL | Freq: Once | INTRAVENOUS | Status: AC
  Administered 2016-12-28: 500 mL via INTRAVENOUS

## 2016-12-28 MED ORDER — RIVAROXABAN 20 MG PO TABS
20.0000 mg | ORAL_TABLET | Freq: Once | ORAL | Status: AC
  Administered 2016-12-28: 20 mg via ORAL
  Filled 2016-12-28: qty 1

## 2016-12-28 MED ORDER — ETOMIDATE 2 MG/ML IV SOLN
5.0000 mg | Freq: Once | INTRAVENOUS | Status: DC
  Filled 2016-12-28: qty 10

## 2016-12-28 MED ORDER — RIVAROXABAN (XARELTO) EDUCATION KIT FOR AFIB PATIENTS
PACK | Freq: Once | Status: AC
  Administered 2016-12-28: 04:00:00
  Filled 2016-12-28: qty 1

## 2016-12-28 NOTE — ED Provider Notes (Signed)
[facility] DEPT Provider Note   CSN: [id number] Arrival date & time: 12/27/16  2324     History   Chief Complaint Chief Complaint  Patient presents with  . Palpitations    HPI Paige Jones is a 66 y.o. female.  The history is provided by the patient and the spouse.  Palpitations   This is a new problem. The current episode started 3 to 5 hours ago. The problem occurs constantly. The problem has not changed since onset.Associated with: watching TV. Associated symptoms include headaches and dizziness. Pertinent negatives include no diaphoresis, no fever, no chest pain, no syncope, no hemoptysis and no shortness of breath. She has tried nothing for the symptoms.  pt reports at approximately 2200 on 10/7 she had onset of palpitations/heart racing while watching TV No CP/SOB No syncope She reports working in yard today but no issues until 2200 No h/o CAD/CVA/PE No h/o afib Seen cardiology Wynonia Lawman) in the past for palpitations, and reports having "normal" stress test but no formal diagnosis  Past Medical History:  Diagnosis Date  . Arachnoid cyst   . Arthritis   . Basal cell carcinoma   . Hyperlipidemia   . Hypertension   . Thyroid goiter     Patient Active Problem List   Diagnosis Date Noted  . Hyperlipidemia 07/17/2016  . Hypothyroid 07/17/2016  . Allergic rhinitis 07/17/2016  . GERD (gastroesophageal reflux disease) 07/17/2016  . Localized edema 07/17/2016  . Intracranial arachnoid cyst 06/03/2015  . Mallet deformity of third finger, right acquired 05/07/2015  . Primary osteoarthritis of first carpometacarpal joint of right hand 05/07/2015  . Pain in joint, pelvic region and thigh 02/03/2011    Past Surgical History:  Procedure Laterality Date  . BASAL CELL CARCINOMA EXCISION      OB History    No data available       Home Medications    Prior to Admission medications   Medication Sig Start Date End Date Taking? Authorizing Provider  aspirin 81  MG tablet 81 mg.   Yes [provider]  atorvastatin (LIPITOR) 20 MG tablet TAKE ONE TABLET BY MOUTH ONCE DAILY. 08/03/16  Yes Midge Minium, MD  Calcium Carbonate-Vit D-Min (CALCIUM 1200 PO) Take by mouth.   Yes [provider]  CETIRIZINE HCL PO Take 1 tablet by mouth at bedtime as needed.    Yes [provider]  diclofenac sodium (VOLTAREN) 1 % GEL Apply 4 g topically 4 (four) times daily. 07/17/16  Yes Midge Minium, MD  estradiol (VIVELLE-DOT) 0.0375 MG/24HR Apply patch twice weekly 05/07/16  Yes [provider]  fenofibrate 160 MG tablet TAKE ONE TABLET BY MOUTH ONCE DAILY. 09/08/16  Yes Midge Minium, MD  furosemide (LASIX) 20 MG tablet TAKE (1) TABLET BY MOUTH ONCE DAILY. 10/28/16  Yes Midge Minium, MD  levothyroxine (SYNTHROID, LEVOTHROID) 75 MCG tablet Take 75 mcg by mouth.   Yes [provider]  Omega-3 Fatty Acids (FISH OIL) 1000 MG CAPS Take by mouth.   Yes [provider]  omeprazole (PRILOSEC) 20 MG capsule TAKE (1) CAPSULE BY MOUTH ONCE DAILY. 10/28/16  Yes Midge Minium, MD  vitamin E (VITAMIN E) 400 UNIT capsule 400 Units.   Yes [provider]  diclofenac (VOLTAREN) 75 MG EC tablet TAKE (1) TABLET BY MOUTH TWICE DAILY. 09/09/16   Midge Minium, MD    Family History Family History  Problem Relation Age of Onset  . Thyroid disease Mother   .  Atrial fibrillation Mother   . Fibromyalgia Mother   . Arthritis Mother   . Emphysema Father   . Diabetes Father   . Heart disease Father   . Cancer Father        gall bladder    Social History Social History  Substance Use Topics  . Smoking status: Never Smoker  . Smokeless tobacco: Never Used  . Alcohol use No     Allergies   Pseudoephedrine and Rosuvastatin   Review of Systems Review of Systems  Constitutional: Negative for diaphoresis and fever.  Respiratory: Negative for hemoptysis and shortness of breath.     Cardiovascular: Positive for palpitations. Negative for chest pain and syncope.  Gastrointestinal: Negative for blood in stool.  Neurological: Positive for dizziness and headaches. Negative for syncope.  All other systems reviewed and are negative.    Physical Exam Updated Vital Signs BP 139/84   Pulse (!) 101   Temp 98.5 F (36.9 C) (Oral)   Resp 18   Ht 1.575 m ('5\' 2"' )   Wt 71.7 kg (158 lb)   SpO2 93%   BMI 28.90 kg/m   Physical Exam CONSTITUTIONAL: Well developed/well nourished HEAD: Normocephalic/atraumatic EYES: EOMI/PERRL ENMT: Mucous membranes moist NECK: supple no meningeal signs SPINE/BACK:entire spine nontender CV: tachycardic, irregular, no loud murmurs LUNGS: Lungs are clear to auscultation bilaterally, no apparent distress ABDOMEN: soft, nontender, no rebound or guarding, bowel sounds noted throughout abdomen GU:no cva tenderness NEURO: Pt is awake/alert/appropriate, moves all extremitiesx4.  No facial droop.   EXTREMITIES: pulses normal/equal, full ROM SKIN: warm, color normal PSYCH: no abnormalities of mood noted, alert and oriented to situation   ED Treatments / Results  Labs (all labs ordered are listed, but only abnormal results are displayed) Labs Reviewed  BASIC METABOLIC PANEL - Abnormal; Notable for the following:       Result Value   Potassium 3.2 (*)    Glucose, Bld 152 (*)    All other components within normal limits  CBC - Abnormal; Notable for the following:    WBC 11.0 (*)    All other components within normal limits  TSH  T4  T3  I-STAT TROPONIN, ED    EKG  EKG Interpretation  Date/Time:  Sunday December 27 2016 23:41:35 EDT Ventricular Rate:  146 PR Interval:    QRS Duration: 77 QT Interval:  307 QTC Calculation: 479 R Axis:   48 Text Interpretation:  Atrial fibrillation ST depression, probably rate related No previous ECGs available Confirmed by Ripley Fraise [phone]) on 12/27/2016 11:44:50 PM       Radiology Dg  Chest Port 1 View  Result Date: 12/28/2016 CLINICAL DATA:  Palpitations, onset tonight. EXAM: PORTABLE CHEST 1 VIEW COMPARISON:  None. FINDINGS: A single AP portable view of the chest demonstrates no focal airspace consolidation or alveolar edema. The lungs are grossly clear. There is no large effusion or pneumothorax. Cardiac and mediastinal contours appear unremarkable. IMPRESSION: No active disease. Electronically Signed   By: Andreas Newport M.D.   On: 12/28/2016 00:11    Procedures Procedures   CRITICAL CARE Performed by: Sharyon Cable Total critical care time: 31 minutes Critical care time was exclusive of separately billable procedures and treating other patients. Critical care was necessary to treat or prevent imminent or life-threatening deterioration. Critical care was time spent personally by me on the following activities: development of treatment plan with patient and/or surrogate as well as nursing, discussions with consultants, evaluation of patient's response to treatment, examination  of patient, obtaining history from patient or surrogate, ordering and performing treatments and interventions, ordering and review of laboratory studies, ordering and review of radiographic studies, pulse oximetry and re-evaluation of patient's condition. PATIENT REQUIRED IV CARDIZEM DRIP FOR ATRIAL FIBRILLATION WITH RVR   Medications Ordered in ED Medications  rivaroxaban (XARELTO) Education Kit for Afib patients (not administered)  rivaroxaban (XARELTO) tablet 20 mg (not administered)  diltiazem (CARDIZEM) 100 mg in dextrose 5% 137m (1 mg/mL) infusion (0 mg/hr Intravenous Stopped 12/28/16 0158)  sodium chloride 0.9 % bolus 500 mL (0 mLs Intravenous Stopped 12/28/16 0158)     Initial Impression / Assessment and Plan / ED Course  I have reviewed the triage vital signs and the nursing notes.  Pertinent labs & imaging results that were available during my care of the patient were reviewed  by me and considered in my medical decision making (see chart for details).     This patients CHA2DS2-VASc Score and unadjusted Ischemic Stroke Rate (% per year) is equal to 3.2 % stroke rate/year from a score of 3  Above score calculated as 1 point each if present [CHF, HTN, DM, Vascular=MI/PAD/Aortic Plaque, Age if 65-74, or Female] Above score calculated as 2 points each if present [Age > 75, or Stroke/TIA/TE]  3:02 AM Repeat ekg is now back in sinus rhythm without cardioversion  EKG Interpretation  Date/Time:  Monday December 28 2016 02:55:27 EDT Ventricular Rate:  90 PR Interval:    QRS Duration: 69 QT Interval:  342 QTC Calculation: 419 R Axis:   26 Text Interpretation:  Sinus rhythm Anteroseptal infarct, age indeterminate pt is now back in sinus rhythm Confirmed by WRipley [phone] on 12/28/2016 3:02:39 AM      4:25 AM Pt remains in sinus, no cardioversion was required SHE FEELS WELL FOR D/C HOME XARELTO INITIATED SHE WAS REFERRED TO ATRIAL FIBRILLATION CLINIC BUT SHE IS ALREADY ESTABLISHED WITH DR TWynonia LawmanDISCUSSED AT LENGTH BLEEDING RISK WITH XARELTO, WE DISCUSSED APPROPRIATE TIMES TO TAKE MEDS, AND HOW TO MANAGE ANY BLEEDING EPISODES  WILL D/C HOME   Final Clinical Impressions(s) / ED Diagnoses   Final diagnoses:  Paroxysmal atrial fibrillation ([place]    New Prescriptions New Prescriptions   RIVAROXABAN (XARELTO) 20 MG TABS TABLET    Take 1 tablet (20 mg total) by mouth daily with supper.     WRipley Fraise MD 12/28/16 [phone]

## 2016-12-28 NOTE — ED Notes (Signed)
Stopped per Dr. Christy Gentles

## 2016-12-29 DIAGNOSIS — Z8249 Family history of ischemic heart disease and other diseases of the circulatory system: Secondary | ICD-10-CM | POA: Diagnosis not present

## 2016-12-29 DIAGNOSIS — R002 Palpitations: Secondary | ICD-10-CM | POA: Diagnosis not present

## 2016-12-29 DIAGNOSIS — E039 Hypothyroidism, unspecified: Secondary | ICD-10-CM | POA: Diagnosis not present

## 2016-12-29 DIAGNOSIS — I480 Paroxysmal atrial fibrillation: Secondary | ICD-10-CM | POA: Diagnosis not present

## 2016-12-29 DIAGNOSIS — E663 Overweight: Secondary | ICD-10-CM | POA: Diagnosis not present

## 2016-12-29 DIAGNOSIS — R000 Tachycardia, unspecified: Secondary | ICD-10-CM | POA: Diagnosis not present

## 2016-12-29 DIAGNOSIS — R079 Chest pain, unspecified: Secondary | ICD-10-CM | POA: Diagnosis not present

## 2016-12-29 DIAGNOSIS — E785 Hyperlipidemia, unspecified: Secondary | ICD-10-CM | POA: Diagnosis not present

## 2016-12-29 LAB — T4: T4, Total: 8.6 ug/dL (ref 4.5–12.0)

## 2016-12-29 LAB — T3: T3, Total: 95 ng/dL (ref 71–180)

## 2016-12-31 ENCOUNTER — Other Ambulatory Visit (HOSPITAL_BASED_OUTPATIENT_CLINIC_OR_DEPARTMENT_OTHER): Payer: Self-pay

## 2016-12-31 DIAGNOSIS — E669 Obesity, unspecified: Secondary | ICD-10-CM

## 2017-01-01 ENCOUNTER — Telehealth (HOSPITAL_COMMUNITY): Payer: Self-pay | Admitting: *Deleted

## 2017-01-01 NOTE — Telephone Encounter (Signed)
LMOM for pt to [facility] regarding sched appt from referral..  Pt ref from ED

## 2017-01-04 ENCOUNTER — Ambulatory Visit: Payer: PPO | Attending: Cardiology | Admitting: Neurology

## 2017-01-04 DIAGNOSIS — G4733 Obstructive sleep apnea (adult) (pediatric): Secondary | ICD-10-CM | POA: Diagnosis not present

## 2017-01-04 DIAGNOSIS — Z79899 Other long term (current) drug therapy: Secondary | ICD-10-CM | POA: Diagnosis not present

## 2017-01-04 DIAGNOSIS — Z7901 Long term (current) use of anticoagulants: Secondary | ICD-10-CM | POA: Diagnosis not present

## 2017-01-04 DIAGNOSIS — R5383 Other fatigue: Secondary | ICD-10-CM | POA: Insufficient documentation

## 2017-01-04 DIAGNOSIS — Z0000 Encounter for general adult medical examination without abnormal findings: Secondary | ICD-10-CM | POA: Diagnosis not present

## 2017-01-04 DIAGNOSIS — R0683 Snoring: Secondary | ICD-10-CM | POA: Insufficient documentation

## 2017-01-04 DIAGNOSIS — E669 Obesity, unspecified: Secondary | ICD-10-CM

## 2017-01-04 DIAGNOSIS — E782 Mixed hyperlipidemia: Secondary | ICD-10-CM | POA: Diagnosis not present

## 2017-01-04 DIAGNOSIS — E038 Other specified hypothyroidism: Secondary | ICD-10-CM | POA: Diagnosis not present

## 2017-01-04 DIAGNOSIS — G930 Cerebral cysts: Secondary | ICD-10-CM | POA: Diagnosis not present

## 2017-01-04 DIAGNOSIS — I480 Paroxysmal atrial fibrillation: Secondary | ICD-10-CM | POA: Diagnosis not present

## 2017-01-09 NOTE — Procedures (Signed)
  Coupland A. Merlene Laughter, MD     [email or web address]             HOME SLEEP TEST  LOCATION: [facility]   INDICATION: Snoring and fatigue   MEDS:  Current Outpatient Prescriptions:  .  atorvastatin (LIPITOR) 20 MG tablet, TAKE ONE TABLET BY MOUTH ONCE DAILY., Disp: 30 tablet, Rfl: 6 .  Calcium Carbonate-Vit D-Min (CALCIUM 1200 PO), Take by mouth., Disp: , Rfl:  .  CETIRIZINE HCL PO, Take 1 tablet by mouth at bedtime as needed. , Disp: , Rfl:  .  diclofenac sodium (VOLTAREN) 1 % GEL, Apply 4 g topically 4 (four) times daily., Disp: 100 g, Rfl: 3 .  estradiol (VIVELLE-DOT) 0.0375 MG/24HR, Apply patch twice weekly, Disp: , Rfl:  .  fenofibrate 160 MG tablet, TAKE ONE TABLET BY MOUTH ONCE DAILY., Disp: 90 tablet, Rfl: 0 .  furosemide (LASIX) 20 MG tablet, TAKE (1) TABLET BY MOUTH ONCE DAILY., Disp: 30 tablet, Rfl: 6 .  levothyroxine (SYNTHROID, LEVOTHROID) 75 MCG tablet, Take 75 mcg by mouth., Disp: , Rfl:  .  Omega-3 Fatty Acids (FISH OIL) 1000 MG CAPS, Take by mouth., Disp: , Rfl:  .  omeprazole (PRILOSEC) 20 MG capsule, TAKE (1) CAPSULE BY MOUTH ONCE DAILY., Disp: 30 capsule, Rfl: 6 .  rivaroxaban (XARELTO) 20 MG TABS tablet, Take 1 tablet (20 mg total) by mouth daily with supper., Disp: 30 tablet, Rfl: 0 .  vitamin E (VITAMIN E) 400 UNIT capsule, 400 Units., Disp: , Rfl:     STUDY:  The total recording time is 528 min.  The diagnostic AHI is 2.5 and lowest oxygen saturation was 89%.    IMPRESSION:    This is unremarkable home sleep study.  The  Delano Metz, MD Diplomate, American Board of Sleep Medicine.  ELECTRONICALLY SIGNED ON:  01/09/2017, 12:31 PM [place] PH: [phone]   FX: [phone] [place]

## 2017-01-13 ENCOUNTER — Ambulatory Visit: Payer: PPO | Admitting: Family Medicine

## 2017-01-15 ENCOUNTER — Encounter: Payer: Self-pay | Admitting: Neurology

## 2017-01-15 ENCOUNTER — Ambulatory Visit (INDEPENDENT_AMBULATORY_CARE_PROVIDER_SITE_OTHER): Payer: PPO | Admitting: Neurology

## 2017-01-15 VITALS — BP 118/66 | HR 74 | Ht 62.0 in | Wt 157.8 lb

## 2017-01-15 DIAGNOSIS — G930 Cerebral cysts: Secondary | ICD-10-CM

## 2017-01-15 DIAGNOSIS — H8110 Benign paroxysmal vertigo, unspecified ear: Secondary | ICD-10-CM | POA: Diagnosis not present

## 2017-01-15 DIAGNOSIS — R51 Headache: Secondary | ICD-10-CM | POA: Diagnosis not present

## 2017-01-15 DIAGNOSIS — R519 Headache, unspecified: Secondary | ICD-10-CM

## 2017-01-15 MED ORDER — PREDNISONE 10 MG PO TABS: ORAL_TABLET | ORAL | 0 refills | Status: DC

## 2017-01-15 NOTE — Progress Notes (Signed)
NEUROLOGY FOLLOW UP OFFICE NOTE  SHAHEEN STAR [id number]  HISTORY OF PRESENT ILLNESS: Paige Jones is a 66 year old right-handed female whom I previously saw for an incidental intracranial arachnoid cyst who follows up now for headaches.  She is accompanied by her husband who supplements history  These are new headaches that started about 3 weeks ago.  She started experiencing dizziness and palpitations.  She presented to the ED on 12/27/16 and was found to be in atrial fibrillation.  She was started on diltiazem and Xarelto, which was later changed to Eliquis due to dizziness.  Headaches are in the occipital and temporal regions bilaterally.  It is not a thunderclap headache.  They are mild-moderate and a constant non-throbbing pressure.  There are no associated symptoms such as nausea, vomiting, photophobia or visual disturbance.  Home sleep study from 01/04/17 was normal.  There are no exacerbating or relieving factors.  She takes Tylenol.  She also has brief episodes of spinning sensation, which is positional, such as bending forward or turning in bed.  It lasts up to a minute.  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  Repeat MRI of brain with and without contrast from 06/11/15 demonstrated stable 2.99 cm arachnoid cyst with mild mass effect but no associated signal abnormality.  PAST MEDICAL HISTORY: Past Medical History:  Diagnosis Date  . Arachnoid cyst   . Arthritis   . Atrial fibrillation ([place])   . Basal cell carcinoma   . Hyperlipidemia   . Hypertension   . Thyroid goiter     MEDICATIONS: Current Outpatient Prescriptions on File Prior to Visit  Medication Sig Dispense Refill  . atorvastatin (LIPITOR) 20 MG tablet TAKE ONE TABLET BY MOUTH ONCE DAILY. 30 tablet 6  . Calcium Carbonate-Vit D-Min (CALCIUM 1200 PO) Take by mouth.    . CETIRIZINE HCL PO Take 1 tablet by  mouth at bedtime as needed.     . diclofenac sodium (VOLTAREN) 1 % GEL Apply 4 g topically 4 (four) times daily. 100 g 3  . estradiol (VIVELLE-DOT) 0.0375 MG/24HR Apply patch twice weekly    . fenofibrate 160 MG tablet TAKE ONE TABLET BY MOUTH ONCE DAILY. 90 tablet 0  . furosemide (LASIX) 20 MG tablet TAKE (1) TABLET BY MOUTH ONCE DAILY. 30 tablet 6  . levothyroxine (SYNTHROID, LEVOTHROID) 75 MCG tablet Take 75 mcg by mouth.    . Omega-3 Fatty Acids (FISH OIL) 1000 MG CAPS Take by mouth.    Marland Kitchen omeprazole (PRILOSEC) 20 MG capsule TAKE (1) CAPSULE BY MOUTH ONCE DAILY. 30 capsule 6  . rivaroxaban (XARELTO) 20 MG TABS tablet Take 1 tablet (20 mg total) by mouth daily with supper. 30 tablet 0  . vitamin E (VITAMIN E) 400 UNIT capsule 400 Units.     No current facility-administered medications on file prior to visit.     ALLERGIES: Allergies  Allergen Reactions  . Pseudoephedrine     Other reaction(s): Other Elevated BP  . Rosuvastatin     Other reaction(s): Other headache    FAMILY HISTORY: Family History  Problem Relation Age of Onset  . Thyroid disease Mother   . Atrial fibrillation Mother   . Fibromyalgia Mother   . Arthritis Mother   . Emphysema Father   . Diabetes Father   . Heart disease Father   . Cancer Father  gall bladder    SOCIAL HISTORY: Social History   Social History  . Marital status: Married    Spouse name: N/A  . Number of children: N/A  . Years of education: N/A   Occupational History  . Not on file.   Social History Main Topics  . Smoking status: Never Smoker  . Smokeless tobacco: Never Used  . Alcohol use No  . Drug use: No  . Sexual activity: Not on file   Other Topics Concern  . Not on file   Social History Narrative  . No narrative on file    REVIEW OF SYSTEMS: Constitutional: No fevers, chills, or sweats, no generalized fatigue, change in appetite Eyes: No visual changes, double vision, eye pain Ear, nose and throat: No  hearing loss, ear pain, nasal congestion, sore throat Cardiovascular: No chest pain, palpitations Respiratory:  No shortness of breath at rest or with exertion, wheezes GastrointestinaI: No nausea, vomiting, diarrhea, abdominal pain, fecal incontinence Genitourinary:  No dysuria, urinary retention or frequency Musculoskeletal:  No neck pain, back pain Integumentary: No rash, pruritus, skin lesions Neurological: as above Psychiatric: No depression, insomnia, anxiety Endocrine: No palpitations, fatigue, diaphoresis, mood swings, change in appetite, change in weight, increased thirst Hematologic/Lymphatic:  No purpura, petechiae. Allergic/Immunologic: no itchy/runny eyes, nasal congestion, recent allergic reactions, rashes  PHYSICAL EXAM: Vitals:   01/15/17 1331  BP: 118/66  Pulse: 74  SpO2: 97%   General: No acute distress.  Patient appears well-groomed.   Head:  Normocephalic/atraumatic Eyes:  Fundi examined but not visualized Neck: supple, no paraspinal tenderness, full range of motion Heart:  Regular rate and rhythm Lungs:  Clear to auscultation bilaterally Back: No paraspinal tenderness Neurological Exam: alert and oriented to person, place, and time. Attention span and concentration intact, recent and remote memory intact, fund of knowledge intact.  Speech fluent and not dysarthric, language intact.  CN II-XII intact. Bulk and tone normal, muscle strength 5/5 throughout.  Sensation to light touch, temperature and vibration intact.  Deep tendon reflexes 2+ throughout, toes downgoing.  Finger to nose and heel to shin testing intact.  Gait normal, Romberg negative.  IMPRESSION: New persistent headache in patient over 50.  May be tension type headache. Benign paroxysmal positional vertigo Intracranial arachnoid cyst  PLAN: 1.  Given that these are new headaches and she has an arachnoid cyst, will repeat MRI of brain with and without contrast to evaluate for any changes. 2.   Prednisone taper to break current persistent headache.  She will contact me in a week if headaches not improved. 3.  Refer for vestibular rehab 4.  Follow up in 3 months.  25 minutes spent face to face with patient, over 50% spent discussing diagnosis and management.  Metta Clines, DO  CC:  Anastasia Pall, MD

## 2017-01-15 NOTE — Patient Instructions (Signed)
1.  Check MRI of brain with and without contrast 2.  Start prednisone taper.  Take 6tabs x1day, then 5tabs x1day, then 4tabs x1day, then 3tabs x1day, then 2tabs x1day, then 1tab x1day, then STOP.  Contact me in one week with update 3.  Refer to vestibular rehabilitation

## 2017-01-20 ENCOUNTER — Ambulatory Visit (HOSPITAL_COMMUNITY): Payer: PPO | Attending: Neurology | Admitting: Physical Therapy

## 2017-01-20 DIAGNOSIS — H8111 Benign paroxysmal vertigo, right ear: Secondary | ICD-10-CM | POA: Insufficient documentation

## 2017-01-20 DIAGNOSIS — G4452 New daily persistent headache (NDPH): Secondary | ICD-10-CM | POA: Insufficient documentation

## 2017-01-20 NOTE — Patient Instructions (Addendum)
Movements: Eyes Only (Pictorial Reference)    Therapist: Use this card with Eye Exercises 14 through 17.   Copyright  VHI. All rights reserved.  Head Motion: Side to Side    Sitting, tilt head down 15-30, slowly move head to right with eyes open. Hold position until symptoms subside. Then, move head slowly to opposite side. Hold position until symptoms subside. Repeat _10___ times per session. Do _4___ sessions per day.  Copyright  VHI. All rights reserved.

## 2017-01-20 NOTE — Therapy (Signed)
Paige Jones, [place], [address] Phone: [phone]   Fax:  [phone]  Physical Therapy Evaluation  Patient Details  Name: Paige Jones MRN: [id number] Date of Birth: [birth year] Referring Provider: Dr. Metta Clines  Encounter Date: 01/20/2017      PT End of Session - 01/20/17 1040    Visit Number 1   Number of Visits 8   Date for PT Re-Evaluation 02/19/17   Authorization Type [employer]   Authorization - Visit Number 1   Authorization - Number of Visits 8   PT Start Time 0950   PT Stop Time 1035   PT Time Calculation (min) 45 min   Activity Tolerance Patient tolerated treatment well   Behavior During Therapy [facility] for tasks assessed/performed      Past Medical History:  Diagnosis Date  . Arachnoid cyst   . Arthritis   . Atrial fibrillation ([place])   . Basal cell carcinoma   . Hyperlipidemia   . Hypertension   . Thyroid goiter     Past Surgical History:  Procedure Laterality Date  . BASAL CELL CARCINOMA EXCISION      There were no vitals filed for this visit.       Subjective Assessment - 01/20/17 0946    Subjective Paige Jones states that she is experiencing dizziness when she gets in and out of bed.  Sometimes it even happens when she turns over in bed.  She experiences the dizziness 3-4 times a night when she gets up to go to the restroom and and 3-4 times durng the day.  The dizziness usually only lasts a few seconds.  She has had a constant headache since October 1st.  She has just finished a course of predinsone and has been on allergy medication without much improvement.     Pertinent History HTN, arthritis, A-Fib, arachonid cyst, CA   How long can you sit comfortably? no problem    How long can you stand comfortably? no problem    How long can you walk comfortably? no problem    Patient Stated Goals to be in better balance, less headaches and dizziness, cataracts    Currently in  Pain? Yes   Pain Score 6    Pain Location Head   Pain Orientation Posterior   Pain Descriptors / Indicators Aching;Nagging   Pain Type Chronic pain   Pain Frequency Constant   Aggravating Factors  taking glasses on and off    Pain Relieving Factors no sure             [facility] PT Assessment - 01/20/17 0001      Assessment   Medical Diagnosis BPPV   Referring Provider Dr. Metta Clines   Onset Date/Surgical Date 12/21/16   Next MD Visit not scheduled   Prior Therapy none     Precautions   Precautions None     Restrictions   Weight Bearing Restrictions No     Balance Screen   Has the patient fallen in the past 6 months No   Has the patient had a decrease in activity level because of a fear of falling?  No   Is the patient reluctant to leave their home because of a fear of falling?  No     Home Ecologist residence     Prior Function   Level of Independence Independent     Cognition   Overall Cognitive Status Within Functional  Limits for tasks assessed     ROM / Strength   AROM / PROM / Strength AROM     AROM   AROM Assessment Site Cervical   Cervical Flexion wfl   Cervical Extension wfl   Cervical - Right Side Bend wfl   Cervical - Left Side Bend wfl   Cervical - Right Rotation wfl   Cervical - Left Rotation wfl            Vestibular Assessment - 01/20/17 0001      Vestibular Assessment   General Observation normal     Symptom Behavior   Type of Dizziness Imbalance  during day; room spinning at night    Frequency of Dizziness 3-4 times day and night    Duration of Dizziness few seconds    Aggravating Factors Supine to sit;Rolling to left;Rolling to right;Forward bending   Relieving Factors Rest     Occulomotor Exam   Occulomotor Alignment Normal   Head shaking Horizontal Absent   Head Shaking Vertical Absent   Smooth Pursuits Saccades   Saccades Poor trajectory   Comment right eye      Vestibulo-Occular Reflex    VOR 1 Head Only (x 1 viewing) felt funny with left downward gaze;    VOR 2 Head and Object (x 2 viewing) --  good      Positional Testing   Dix-Hallpike Dix-Hallpike Right;Dix-Hallpike Left     Dix-Hallpike Right   Dix-Hallpike Right Duration less than 10 seconds    Dix-Hallpike Right Symptoms Upbeat, right rotatory nystagmus     Dix-Hallpike Left   Dix-Hallpike Left Symptoms No nystagmus     Positional Sensitivities   Right Hallpike Mild dizziness   Up from Right Hallpike Moderate dizziness   Up from Left Hallpike Lightheadedness        Objective measurements completed on examination: See above findings.           Vestibular Treatment/Exercise - 01/20/17 0001      Vestibular Treatment/Exercise   Vestibular Treatment Provided Canalith Repositioning;Gaze   Canalith Repositioning Epley Manuever Right   Gaze Exercises Eye/Head Exercise Vertical;Eye/Head Exercise Horizontal      EPLEY MANUEVER RIGHT   Number of Reps  2   Overall Response Improved Symptoms     Eye/Head Exercise Horizontal   Foot Position floor   Reps 10     Eye/Head Exercise Vertical   Foot Position floor    Reps 10               PT Education - 01/20/17 1039    Education provided Yes   Education Details Eye exercises and habituation to head turn    Person(s) Educated Patient   Methods Explanation;Handout   Comprehension Verbalized understanding;Returned demonstration          PT Short Term Goals - 01/20/17 1323      PT SHORT TERM GOAL #1   Title Pt to be experiencing vertigo 3 times or less in a 24 hour period.    Time 2   Period Weeks   Status New   Target Date 02/03/17     PT SHORT TERM GOAL #2   Title Pt to verbalize that her headaches have decreased in intensity to no greater than a 4/10    Time 2   Period Weeks   Status New   Target Date 01/20/17           PT Long Term Goals - 01/20/17 1325  PT LONG TERM GOAL #1   Title PT to verbalize that she has not  experienced a headace for the past three days    Time 4   Period Weeks   Status New   Target Date 02/17/17     PT LONG TERM GOAL #2   Title Pt to state that she has not experienced any sx of vertigo for the past week    Time [place] - 02-02-2017 0944    Clinical Impression Statement Paige Jones has been referred for BPPV.  She states that since the first of October she has had a constant headache as well as periods of short duration dizziness with specific motions.  Evaluation demonstrates tight upper trapezius mm and  positive Rt [employer] .   Ms. Lonigro will benefit from skilled  out patient therapy for manual completing Eply's manuever for canalith repositioning, and decreasing mm tension in her upper traps as well as high balance activity with head motion to return her to her prior functional level.     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    PT Home Exercise Plan Eval:  head turns and eye exercies.    Consulted and Agree with Plan of Care Patient      Patient will benefit from skilled therapeutic intervention in order to improve the following deficits and impairments:  Pain, Dizziness  Visit Diagnosis: BPPV (benign paroxysmal positional vertigo), right  New daily persistent headache      G-Codes - 02/02/17 1327    Functional Assessment Tool Used (Outpatient Only)  clinical judgement:  frequency and duration of dizziness    Functional Limitation Other PT primary   Other PT Primary Current Status (N8295) At least 20 percent but less than 40 percent impaired, limited or restricted   Other PT Primary Goal Status (A2130) At least 1 percent but less than 20 percent impaired, limited or restricted       Problem List Patient Active Problem List   Diagnosis Date Noted  . Hyperlipidemia 07/17/2016  . Hypothyroid 07/17/2016  . Allergic rhinitis 07/17/2016  . GERD (gastroesophageal reflux disease) 07/17/2016  . Localized edema 07/17/2016  . Intracranial arachnoid cyst 06/03/2015  . Mallet deformity of third finger, right acquired 05/07/2015  . Primary osteoarthritis of first carpometacarpal joint of right hand 05/07/2015  . Pain in joint, pelvic region and thigh 02/03/2011   Rayetta Humphrey, PT CLT [phone] Feb 02, 2017, 1:31 PM  [facility] [address] [place], [place], [address] Phone: [phone]   Fax:  [phone]  Name: Paige Jones MRN: [id number] Date of Birth: 08/16/1950

## 2017-01-22 ENCOUNTER — Telehealth: Payer: Self-pay | Admitting: Neurology

## 2017-01-22 MED ORDER — NORTRIPTYLINE HCL 25 MG PO CAPS: 25.0000 mg | ORAL_CAPSULE | Freq: Every day | ORAL | 3 refills | Status: DC

## 2017-01-22 NOTE — Telephone Encounter (Signed)
Spoke with Pt, headaches aren't any better, actually at the back of her head, seems worse. Prednisone didn't seem to help. Vestibular therapy did help with "swimmy headed" feeling she had when she would stand from a sitting or lying position. MRI scheduled for 01/31/17.

## 2017-01-22 NOTE — Telephone Encounter (Signed)
We can start nortriptyline 25mg  at bedtime, which is an antidepressant used to reduce frequency of headaches.  We can increase dose in 4 weeks if needed (she should contact me).  However, if her headache is getting worse, like her ears are "going to explode", then she may not want to wait for the MRI and instead be seen in the ED.

## 2017-01-22 NOTE — Telephone Encounter (Signed)
Called and spoke with Pt, advsd her of Dr Starr Lake recommendation to start nortriptyline 25mg  QHS, also she may want to be seen in ED if ears feel like they are going to explode. Pt verbalized understanding and will call in 4 wks with update. Rx sent to [facility] in [place]

## 2017-01-22 NOTE — Telephone Encounter (Signed)
Patient called needing to let Dr. Tomi Likens know that the steroid he put her on has not helped her headaches much. She is still having headaches in the back of her head and she said it feels like "her ears could explode". Please Call. Thanks

## 2017-01-26 ENCOUNTER — Ambulatory Visit (HOSPITAL_COMMUNITY): Payer: PPO | Attending: Neurology

## 2017-01-26 ENCOUNTER — Encounter (HOSPITAL_COMMUNITY): Payer: Self-pay

## 2017-01-26 DIAGNOSIS — H8111 Benign paroxysmal vertigo, right ear: Secondary | ICD-10-CM | POA: Insufficient documentation

## 2017-01-26 DIAGNOSIS — G4452 New daily persistent headache (NDPH): Secondary | ICD-10-CM | POA: Diagnosis not present

## 2017-01-26 NOTE — Therapy (Signed)
[place], [place], [address] Phone: [phone]   Fax:  [phone]  Physical Therapy Treatment  Patient Details  Name: Paige Jones MRN: [id number] Date of Birth: 1950-08-13 Referring Provider: Dr. Metta Clines   Encounter Date: 01/26/2017  PT End of Session - 01/26/17 0955    Visit Number  2    Number of Visits  8    Date for PT Re-Evaluation  02/19/17    Authorization Type  [employer]    Authorization - Visit Number  2    Authorization - Number of Visits  8    PT Start Time  [phone]    PT Stop Time  1030    PT Time Calculation (min)  41 min    Activity Tolerance  Patient tolerated treatment well    Behavior During Therapy  [facility] for tasks assessed/performed       Past Medical History:  Diagnosis Date  . Arachnoid cyst   . Arthritis   . Atrial fibrillation ([place])   . Basal cell carcinoma   . Hyperlipidemia   . Hypertension   . Thyroid goiter     Past Surgical History:  Procedure Laterality Date  . BASAL CELL CARCINOMA EXCISION      There were no vitals filed for this visit.  Subjective Assessment - 01/26/17 0952    Subjective  Pt reports she has had decrease in "swimmy headedness" following last session with no reports since last session.  Continues to have headache on back of head.  MRI scheduled on 01/31/17.  Does feel medication plays a part in the swimminess.    Pertinent History  HTN, arthritis, A-Fib, arachonid cyst, CA    Patient Stated Goals  to be in better balance, less headaches and dizziness, cataracts     Currently in Pain?  Yes    Pain Score  2     Pain Location  Head    Pain Orientation  Posterior    Pain Descriptors / Indicators  Constant;Pressure    Pain Type  Chronic pain    Pain Onset  More than a month ago    Pain Frequency  Constant    Aggravating Factors   taking glasses on and off    Pain Relieving Factors  not sure           [facility] Adult PT  Treatment/Exercise - 01/26/17 0001      Manual Therapy   Manual Therapy  Soft tissue mobilization    Manual therapy comments  Manual complete separate than rest of tx    Soft tissue mobilization  supine position with LE elevated STM to upper traps with position release technique and suboccipital release technqiues 2x          Balance Exercises - 01/26/17 1033      Balance Exercises: Standing   Tandem Stance  Eyes open;4 reps;Foam/compliant surface Tandem stance on foam with eye movements then head movemenst   Tandem stance on foam with eye movements then head movemenst   SLS  Eyes open;Solid surface;3 reps Rt 36", Lt 41" max of 3   Rt 36", Lt 41" max of 3       PT Education - 01/26/17 1021    Education provided  Yes    Education Details  Reviewed goals, assured compliance iwht HEP and copy of eval given     Person(s) Educated  Patient    Methods  Explanation;Demonstration;Handout  Comprehension  Verbalized understanding;Returned demonstration       PT Short Term Goals - 01/20/17 1323      PT SHORT TERM GOAL #1   Title  Pt to be experiencing vertigo 3 times or less in a 24 hour period.     Time  2    Period  Weeks    Status  New    Target Date  02/03/17      PT SHORT TERM GOAL #2   Title  Pt to verbalize that her headaches have decreased in intensity to no greater than a 4/10     Time  2    Period  Weeks    Status  New    Target Date  01/20/17        PT Long Term Goals - 01/20/17 1325      PT LONG TERM GOAL #1   Title  PT to verbalize that she has not experienced a headace for the past three days     Time  4    Period  Weeks    Status  New    Target Date  02/17/17      PT LONG TERM GOAL #2   Title  Pt to state that she has not experienced any sx of vertigo for the past week     Time  4    Period  Weeks    Status  New            Plan - 01/26/17 1050    Clinical Impression Statement  Reviewed goals, assured compliance and proper technique with  HEP and copy of eval given to pt.  Pt reports no dizziness since last session, did reports some swimmyheadedness over weekend but none currently.  Began session with manual to address tightness in upper trapezius with reports of relief following.  Does continue to c/o headache following manual, encouraged to pt stay hydrated to reduce risk of headache getting worse.  Balance activities with focus on eye and head movements on dynamic surface, min A for safety.      Rehab Potential  Good    PT Frequency  2x / week    PT Duration  4 weeks    PT Treatment/Interventions  Manual techniques;Patient/family education;Neuromuscular re-education    PT Next Visit Plan  If pt has still been experiencing dizziness complete Eply manuever, manual to include suboccipital release as well as high balance activity such as tandem stance on foam with head turns. Progress to tandem gait with head turns.      PT Home Exercise Plan  Eval:  head turns and eye exercies.        Patient will benefit from skilled therapeutic intervention in order to improve the following deficits and impairments:  Pain, Dizziness  Visit Diagnosis: BPPV (benign paroxysmal positional vertigo), right     Problem List Patient Active Problem List   Diagnosis Date Noted  . Hyperlipidemia 07/17/2016  . Hypothyroid 07/17/2016  . Allergic rhinitis 07/17/2016  . GERD (gastroesophageal reflux disease) 07/17/2016  . Localized edema 07/17/2016  . Intracranial arachnoid cyst 06/03/2015  . Mallet deformity of third finger, right acquired 05/07/2015  . Primary osteoarthritis of first carpometacarpal joint of right hand 05/07/2015  . Pain in joint, pelvic region and thigh 02/03/2011   Ihor Austin, [place]; [place]  Aldona Lento 01/26/2017, 3:02 PM  [facility] [address] [place], [place], [address] Phone: [phone]   Fax:  [phone]  Name: Paige Jones MRN:  [id number] Date of Birth: 04/04/1950

## 2017-01-29 ENCOUNTER — Encounter (HOSPITAL_COMMUNITY): Payer: Self-pay

## 2017-01-29 ENCOUNTER — Ambulatory Visit (HOSPITAL_COMMUNITY): Payer: PPO

## 2017-01-29 DIAGNOSIS — H8111 Benign paroxysmal vertigo, right ear: Secondary | ICD-10-CM | POA: Diagnosis not present

## 2017-01-29 NOTE — Therapy (Signed)
[place], [place], [address] Phone: [phone]   Fax:  [phone]  Physical Therapy Treatment  Patient Details  Name: Paige Jones MRN: [id number] Date of Birth: February 25, 1951 Referring Provider: Dr. Metta Clines   Encounter Date: 01/29/2017  PT End of Session - 01/29/17 0949    Visit Number  3    Number of Visits  8    Date for PT Re-Evaluation  02/19/17    Authorization Type  [employer]    Authorization - Visit Number  3    Authorization - Number of Visits  8    PT Start Time  0945    PT Stop Time  1030    PT Time Calculation (min)  45 min    Activity Tolerance  Patient tolerated treatment well    Behavior During Therapy  [facility] for tasks assessed/performed       Past Medical History:  Diagnosis Date  . Arachnoid cyst   . Arthritis   . Atrial fibrillation ([place])   . Basal cell carcinoma   . Hyperlipidemia   . Hypertension   . Thyroid goiter     Past Surgical History:  Procedure Laterality Date  . BASAL CELL CARCINOMA EXCISION      There were no vitals filed for this visit.  Subjective Assessment - 01/29/17 0941    Subjective  Pt reports she has decrease in "dizzy episodes, swimmy headedness" following last session.  Feels the medication and eye contribute to some of the dizzyness.  Does continue to have some pressure on back of head, has MRI scheduled for 01/31/17.      Pertinent History  HTN, arthritis, A-Fib, arachonid cyst, CA    Patient Stated Goals  to be in better balance, less headaches and dizziness, cataracts     Currently in Pain?  No/denies                      [facility] Adult PT Treatment/Exercise - 01/29/17 0001      Manual Therapy   Manual Therapy  Soft tissue mobilization    Manual therapy comments  Manual complete separate than rest of tx    Soft tissue mobilization  supine position with LE elevated STM to upper traps with position release technique and  suboccipital release technqiues 2x          Balance Exercises - 01/29/17 0958      Balance Exercises: Standing   Tandem Stance  Eyes open;4 reps;Foam/compliant surface eye/head gaze exercises 10x    SLS  Eyes open;Solid surface;3 reps    SLS with Vectors  3 reps 3x 5" BLE    Balance Master: Limits for Stability  Test 1:35    Balance Master: Dynamic  Test A 93%, B 7%    Balance Beam  4RT 2 reps with head turns    Gait with Head Turns  -- 226 feet DGI techniques    Tandem Gait  Forward;Retro;3 reps 1RT with head turns          PT Short Term Goals - 01/20/17 1323      PT SHORT TERM GOAL #1   Title  Pt to be experiencing vertigo 3 times or less in a 24 hour period.     Time  2    Period  Weeks    Status  New    Target Date  02/03/17      PT SHORT TERM GOAL #  2   Title  Pt to verbalize that her headaches have decreased in intensity to no greater than a 4/10     Time  2    Period  Weeks    Status  New    Target Date  01/20/17        PT Long Term Goals - 01/20/17 1325      PT LONG TERM GOAL #1   Title  PT to verbalize that she has not experienced a headace for the past three days     Time  4    Period  Weeks    Status  New    Target Date  02/17/17      PT LONG TERM GOAL #2   Title  Pt to state that she has not experienced any sx of vertigo for the past week     Time  4    Period  Weeks    Status  New            Plan - 01/29/17 1213    Clinical Impression Statement  Pt arrived with reports of no episodes of dizzyness following last session.  Session focus on dynamic balance training incorporating eye gaze and head movements with min guard/A for safety.  EOS with manual soft tissue mobilizaiton to address spasms/tightness cervical region.  Pt reports decrease in headache intensity following manual last session, does have MRI scheduled for Monday to address pressure posterior head.  Pt given additional balance exercises to add to HEP.      Rehab Potential  Good     PT Frequency  2x / week    PT Duration  4 weeks    PT Treatment/Interventions  Manual techniques;Patient/family education;Neuromuscular re-education    PT Next Visit Plan  Continue dynamic balance activities and maual to cervical region including suboccipital release as well.  Eply manuever PRN if c/o dizziness.      PT Home Exercise Plan  Eval:  head turns and eye exercies.; tandem stance with eye/head movements and SLS.         Patient will benefit from skilled therapeutic intervention in order to improve the following deficits and impairments:  Pain, Dizziness  Visit Diagnosis: BPPV (benign paroxysmal positional vertigo), right     Problem List Patient Active Problem List   Diagnosis Date Noted  . Hyperlipidemia 07/17/2016  . Hypothyroid 07/17/2016  . Allergic rhinitis 07/17/2016  . GERD (gastroesophageal reflux disease) 07/17/2016  . Localized edema 07/17/2016  . Intracranial arachnoid cyst 06/03/2015  . Mallet deformity of third finger, right acquired 05/07/2015  . Primary osteoarthritis of first carpometacarpal joint of right hand 05/07/2015  . Pain in joint, pelvic region and thigh 02/03/2011   Ihor Austin, [place]; [place]  Aldona Lento 01/29/2017, 12:24 PM  [facility] [address] [place], [place], [address] Phone: [phone]   Fax:  [phone]  Name: Paige Jones MRN: [id number] Date of Birth: 1950-11-25

## 2017-01-29 NOTE — Patient Instructions (Signed)
Single Leg Balance: Eyes Open    Stand on right leg with eyes open. Hold60  seconds. 3 reps 1-2 times per day.  http://ggbe.exer.us/5   Copyright  VHI. All rights reserved.   Tandem Stance    Right foot in front of left, heel touching toe both feet "straight ahead". Stand on Foot Triangle of Support with both feet. Balance in this position 30 seconds or 10 reps with head turns and eye gaze exercise. Do with left foot in front of right.  Copyright  VHI. All rights reserved.

## 2017-01-31 ENCOUNTER — Ambulatory Visit
Admission: RE | Admit: 2017-01-31 | Discharge: 2017-01-31 | Disposition: A | Discharge status: Home / Self Care | Payer: PPO | Source: Ambulatory Visit | Attending: Neurology | Admitting: Neurology

## 2017-01-31 DIAGNOSIS — R42 Dizziness and giddiness: Secondary | ICD-10-CM | POA: Diagnosis not present

## 2017-01-31 DIAGNOSIS — R519 Headache, unspecified: Secondary | ICD-10-CM

## 2017-01-31 DIAGNOSIS — G930 Cerebral cysts: Secondary | ICD-10-CM

## 2017-01-31 DIAGNOSIS — R51 Headache: Principal | ICD-10-CM

## 2017-01-31 MED ORDER — GADOBENATE DIMEGLUMINE 529 MG/ML IV SOLN: 15.0000 mL | Freq: Once | INTRAVENOUS | Status: DC | PRN

## 2017-02-01 ENCOUNTER — Ambulatory Visit (HOSPITAL_COMMUNITY): Payer: PPO | Admitting: Physical Therapy

## 2017-02-01 ENCOUNTER — Other Ambulatory Visit: Payer: Self-pay

## 2017-02-01 ENCOUNTER — Encounter (HOSPITAL_COMMUNITY): Payer: Self-pay | Admitting: Physical Therapy

## 2017-02-01 DIAGNOSIS — G4452 New daily persistent headache (NDPH): Secondary | ICD-10-CM

## 2017-02-01 DIAGNOSIS — H8111 Benign paroxysmal vertigo, right ear: Secondary | ICD-10-CM | POA: Diagnosis not present

## 2017-02-01 NOTE — Therapy (Signed)
[name], [place], [address] Phone: [phone]   Fax:  [phone]  Physical Therapy Treatment  Patient Details  Name: Paige Jones MRN: [id number] Date of Birth: Mar 08, 1951 Referring Provider: Dr. Metta Clines   Encounter Date: 02/01/2017  PT End of Session - 02/01/17 1214    Visit Number  4    Number of Visits  8    Date for PT Re-Evaluation  02/19/17    Authorization Type  [employer]    Authorization - Visit Number  4    Authorization - Number of Visits  8    PT Start Time  1030    PT Stop Time  1120    PT Time Calculation (min)  50 min    Activity Tolerance  Patient tolerated treatment well    Behavior During Therapy  [facility] for tasks assessed/performed       Past Medical History:  Diagnosis Date  . Arachnoid cyst   . Arthritis   . Atrial fibrillation ([place])   . Basal cell carcinoma   . Hyperlipidemia   . Hypertension   . Thyroid goiter     Past Surgical History:  Procedure Laterality Date  . BASAL CELL CARCINOMA EXCISION      There were no vitals filed for this visit.  Subjective Assessment - 02/01/17 1031    Subjective  PT states that she has not had any vertigo  and her headaches are better.  She still feels a little off balance.      Pertinent History  HTN, arthritis, A-Fib, arachonid cyst, CA    Patient Stated Goals  to be in better balance, less headaches and dizziness, cataracts     Currently in Pain?  No/denies                      [facility] Adult PT Treatment/Exercise - 02/01/17 0001      Manual Therapy   Manual Therapy  Soft tissue mobilization    Manual therapy comments  Manual complete separate than rest of tx    Soft tissue mobilization  supine position with LE elevated STM to upper traps with position release technique and suboccipital release technqiues 2x          Balance Exercises - 02/01/17 1211      Balance Exercises: Standing   Standing  Eyes Closed  Narrow base of support (BOS);Foam/compliant surface;5 reps with head nods as well     Tandem Stance  Eyes closed;5 reps with head nods     Rockerboard  Anterior/posterior;Lateral;Other time (comment) 1 minute     Balance Master: Limits for Stability  1:35 on moderate     Tandem Gait  Forward;Foam/compliant surface with head turns     Retro Gait  Foam/compliant surface;2 reps    Step Over Hurdles / Cones  on foam x 2 RT     Marching Limitations  on foam with 3" hold           PT Short Term Goals - 01/20/17 1323      PT SHORT TERM GOAL #1   Title  Pt to be experiencing vertigo 3 times or less in a 24 hour period.     Time  2    Period  Weeks    Status  New    Target Date  02/03/17      PT SHORT TERM GOAL #2   Title  Pt to verbalize  that her headaches have decreased in intensity to no greater than a 4/10     Time  2    Period  Weeks    Status  New    Target Date  01/20/17        PT Long Term Goals - 01/20/17 1325      PT LONG TERM GOAL #1   Title  PT to verbalize that she has not experienced a headace for the past three days     Time  4    Period  Weeks    Status  New    Target Date  02/17/17      PT LONG TERM GOAL #2   Title  Pt to state that she has not experienced any sx of vertigo for the past week     Time  4    Period  Weeks    Status  New            Plan - 02/01/17 1214    Clinical Impression Statement  Pt continues to be free from dizziness but feels off balance and continues to have headaches.  Balance activity increases headaches but manual resolves.  Pt continues to improve     Rehab Potential  Good    PT Frequency  2x / week    PT Duration  4 weeks    PT Treatment/Interventions  Manual techniques;Patient/family education;Neuromuscular re-education    PT Next Visit Plan  add lunging on balance beam as well as Warrior poses     PT Home Exercise Plan  Eval:  head turns and eye exercies.; tandem stance with eye/head movements and SLS.          Patient will benefit from skilled therapeutic intervention in order to improve the following deficits and impairments:  Pain, Dizziness  Visit Diagnosis: BPPV (benign paroxysmal positional vertigo), right  New daily persistent headache     Problem List Patient Active Problem List   Diagnosis Date Noted  . Hyperlipidemia 07/17/2016  . Hypothyroid 07/17/2016  . Allergic rhinitis 07/17/2016  . GERD (gastroesophageal reflux disease) 07/17/2016  . Localized edema 07/17/2016  . Intracranial arachnoid cyst 06/03/2015  . Mallet deformity of third finger, right acquired 05/07/2015  . Primary osteoarthritis of first carpometacarpal joint of right hand 05/07/2015  . Pain in joint, pelvic region and thigh 02/03/2011    Rayetta Humphrey, PT CLT [phone] 02/01/2017, 12:16 PM  [name], [place], [address] Phone: [phone]   Fax:  [phone]  Name: Paige Jones MRN: [id number] Date of Birth: 1950-04-04

## 2017-02-04 ENCOUNTER — Encounter (HOSPITAL_COMMUNITY): Payer: PPO | Admitting: Physical Therapy

## 2017-02-04 ENCOUNTER — Telehealth: Payer: Self-pay

## 2017-02-04 NOTE — Telephone Encounter (Signed)
Called and spoke with Pt, advsd her of MRI results. 

## 2017-02-04 NOTE — Telephone Encounter (Signed)
-----   Message from Pieter Partridge, DO sent at 02/01/2017  6:48 AM EST ----- There is nothing concerning on MRI.  The cyst is stable.  There is a small amount of fluid in the sinuses behind the ears but I don't think it is of any clinical significance.

## 2017-02-08 ENCOUNTER — Encounter (HOSPITAL_COMMUNITY): Payer: PPO | Admitting: Physical Therapy

## 2017-02-10 ENCOUNTER — Encounter (HOSPITAL_COMMUNITY): Payer: PPO | Admitting: Physical Therapy

## 2017-02-15 ENCOUNTER — Other Ambulatory Visit: Payer: Self-pay

## 2017-02-15 ENCOUNTER — Encounter (HOSPITAL_COMMUNITY): Payer: Self-pay | Admitting: Physical Therapy

## 2017-02-15 ENCOUNTER — Ambulatory Visit (HOSPITAL_COMMUNITY): Payer: PPO | Admitting: Physical Therapy

## 2017-02-15 DIAGNOSIS — R000 Tachycardia, unspecified: Secondary | ICD-10-CM | POA: Diagnosis not present

## 2017-02-15 DIAGNOSIS — G4452 New daily persistent headache (NDPH): Secondary | ICD-10-CM

## 2017-02-15 DIAGNOSIS — H8111 Benign paroxysmal vertigo, right ear: Secondary | ICD-10-CM | POA: Diagnosis not present

## 2017-02-15 DIAGNOSIS — E663 Overweight: Secondary | ICD-10-CM | POA: Diagnosis not present

## 2017-02-15 DIAGNOSIS — Z8249 Family history of ischemic heart disease and other diseases of the circulatory system: Secondary | ICD-10-CM | POA: Diagnosis not present

## 2017-02-15 DIAGNOSIS — R079 Chest pain, unspecified: Secondary | ICD-10-CM | POA: Diagnosis not present

## 2017-02-15 DIAGNOSIS — E039 Hypothyroidism, unspecified: Secondary | ICD-10-CM | POA: Diagnosis not present

## 2017-02-15 DIAGNOSIS — I480 Paroxysmal atrial fibrillation: Secondary | ICD-10-CM | POA: Diagnosis not present

## 2017-02-15 DIAGNOSIS — E785 Hyperlipidemia, unspecified: Secondary | ICD-10-CM | POA: Diagnosis not present

## 2017-02-15 NOTE — Patient Instructions (Signed)
Self Treatment for Right Posterior / Anterior Canalithiasis    Sitting on bed: 1. Turn head 45 right. (a) Lie back slowly, shoulders on pillow, head on bed. (b) Hold __30__ seconds. 2. Keeping head on bed, turn head 90 left. Hold _30___ seconds. 3. Roll to left, head on 45 angle down toward bed. Hold ____ seconds. 4. Sit up on left side of bed. Repeat __2__ times per session. Do _1___ sessions per day.  Copyright  VHI. All rights reserved.

## 2017-02-15 NOTE — Therapy (Signed)
[place], [place], [address] Phone: [phone]   Fax:  [phone]  Physical Therapy Treatment  Patient Details  Name: Paige Jones MRN: [id number] Date of Birth: 1951/01/15 Referring Provider: Dr. Metta Clines   Encounter Date: 02/15/2017  PT End of Session - 02/15/17 1037    Visit Number  5    Number of Visits  8    Date for PT Re-Evaluation  02/19/17    Authorization Type  [employer]    Authorization - Visit Number  5    Authorization - Number of Visits  8    PT Start Time  0945    PT Stop Time  1030    PT Time Calculation (min)  45 min    Activity Tolerance  Patient tolerated treatment well    Behavior During Therapy  [facility] for tasks assessed/performed       Past Medical History:  Diagnosis Date  . Arachnoid cyst   . Arthritis   . Atrial fibrillation ([place])   . Basal cell carcinoma   . Hyperlipidemia   . Hypertension   . Thyroid goiter     Past Surgical History:  Procedure Laterality Date  . BASAL CELL CARCINOMA EXCISION      There were no vitals filed for this visit.  Subjective Assessment - 02/15/17 1004    Subjective  Pt states that she feel as if her vertigo is returning    Pertinent History  HTN, arthritis, A-Fib, arachonid cyst, CA    Patient Stated Goals  to be in better balance, less headaches and dizziness, cataracts     Currently in Pain?  No/denies             Vestibular Assessment - 02/15/17 0001      Positional Testing   Dix-Hallpike  Dix-Hallpike Right;Dix-Hallpike Left      Dix-Hallpike Right   Dix-Hallpike Right Duration  less than 10 seconds     Dix-Hallpike Right Symptoms  Upbeat, right rotatory nystagmus      Dix-Hallpike Left   Dix-Hallpike Left Symptoms  No nystagmus              [facility] Adult PT Treatment/Exercise - 02/15/17 0001      Manual Therapy   Manual Therapy  Soft tissue mobilization    Manual therapy comments  Manual  complete separate than rest of tx    Soft tissue mobilization  supine position with LE elevated STM to upper traps with position release technique and suboccipital release technqiues 2x      Vestibular Treatment/Exercise - 02/15/17 0001      Vestibular Treatment/Exercise   Vestibular Treatment Provided  Canalith Repositioning       EPLEY MANUEVER RIGHT   Number of Reps   2    Overall Response  Improved Symptoms              PT Short Term Goals - 01/20/17 1323      PT SHORT TERM GOAL #1   Title  Pt to be experiencing vertigo 3 times or less in a 24 hour period.     Time  2    Period  Weeks    Status  New    Target Date  02/03/17      PT SHORT TERM GOAL #2   Title  Pt to verbalize that her headaches have decreased in intensity to no greater than a 4/10     Time  2    Period  Weeks    Status  New    Target Date  01/20/17        PT Long Term Goals - 01/20/17 1325      PT LONG TERM GOAL #1   Title  PT to verbalize that she has not experienced a headace for the past three days     Time  4    Period  Weeks    Status  New    Target Date  02/17/17      PT LONG TERM GOAL #2   Title  Pt to state that she has not experienced any sx of vertigo for the past week     Time  4    Period  Weeks    Status  New            Plan - 02/15/17 1038    Clinical Impression Statement  Pt states that since yesterday she feels like her dizziness is coming back.  Pt had a positive Halpike Dix Right with successful Eply's manuever.  Pt still felt swimmy headed.  BP taken 130/85 which is high for pt.  Pt  already has an appointment to see her cardiologist today.      Rehab Potential  Good    PT Frequency  2x / week    PT Duration  4 weeks    PT Treatment/Interventions  Manual techniques;Patient/family education;Neuromuscular re-education    PT Next Visit Plan  Recheck halpike dix     PT Home Exercise Plan  Eval:  head turns and eye exercies.; tandem stance with eye/head movements  and SLS.         Patient will benefit from skilled therapeutic intervention in order to improve the following deficits and impairments:  Pain, Dizziness  Visit Diagnosis: BPPV (benign paroxysmal positional vertigo), right  New daily persistent headache     Problem List Patient Active Problem List   Diagnosis Date Noted  . Hyperlipidemia 07/17/2016  . Hypothyroid 07/17/2016  . Allergic rhinitis 07/17/2016  . GERD (gastroesophageal reflux disease) 07/17/2016  . Localized edema 07/17/2016  . Intracranial arachnoid cyst 06/03/2015  . Mallet deformity of third finger, right acquired 05/07/2015  . Primary osteoarthritis of first carpometacarpal joint of right hand 05/07/2015  . Pain in joint, pelvic region and thigh 02/03/2011    Rayetta Humphrey, PT CLT [phone] 02/15/2017, 10:42 AM  [place], [place], [address] Phone: [phone]   Fax:  [phone]  Name: CARLA RASHAD MRN: [id number] Date of Birth: [birth year]

## 2017-02-17 ENCOUNTER — Ambulatory Visit (HOSPITAL_COMMUNITY): Payer: PPO | Admitting: Physical Therapy

## 2017-02-17 DIAGNOSIS — H8111 Benign paroxysmal vertigo, right ear: Secondary | ICD-10-CM | POA: Diagnosis not present

## 2017-02-17 DIAGNOSIS — G4452 New daily persistent headache (NDPH): Secondary | ICD-10-CM

## 2017-02-17 NOTE — Patient Instructions (Signed)
Rolling    With pillow under head, start on back. Roll slowly to right. Hold position until symptoms subside. Roll slowly onto left side. Hold position until symptoms subside. Repeat sequence _5___ times per session. Do _5___ sessions per day.  Copyright  VHI. All rights reserved.   

## 2017-02-17 NOTE — Therapy (Signed)
**Note Paige-Identified via Obfuscation** [place], [place], [address] Phone: [phone]   Fax:  [phone]  Physical Therapy Treatment  Patient Details  Name: Paige Jones MRN: [id number] Date of Birth: 08-20-1950 Referring Provider: Dr. Metta Clines   Encounter Date: 02/17/2017  PT End of Session - 02/17/17 1035    Visit Number  6    Number of Visits  8    Date for PT Re-Evaluation  02/19/17    Authorization Type  [employer]    Authorization - Visit Number  6    Authorization - Number of Visits  8    PT Start Time  0950    PT Stop Time  1033    PT Time Calculation (min)  43 min    Activity Tolerance  Patient tolerated treatment well    Behavior During Therapy  [facility] for tasks assessed/performed       Past Medical History:  Diagnosis Date  . Arachnoid cyst   . Arthritis   . Atrial fibrillation ([place])   . Basal cell carcinoma   . Hyperlipidemia   . Hypertension   . Thyroid goiter     Past Surgical History:  Procedure Laterality Date  . BASAL CELL CARCINOMA EXCISION      There were no vitals filed for this visit.  Subjective Assessment - 02/17/17 0949    Subjective  Pt states that she went to her MD and  he does not feel as if her dizziness is not from her medication.  She is still feels light headed all the time and her head feels heavy.  Getting in and out of bed causes the room to spin.      Pertinent History  HTN, arthritis, A-Fib, arachonid cyst, CA    Patient Stated Goals  to be in better balance, less headaches and dizziness, cataracts     Currently in Pain?  No/denies                      [facility] Adult PT Treatment/Exercise - 02/17/17 0001      Manual Therapy   Manual Therapy  Soft tissue mobilization    Manual therapy comments  Manual complete separate than rest of tx    Soft tissue mobilization  supine position with LE elevated STM to upper traps with position release technique and suboccipital  release technqiues 2x      Vestibular Treatment/Exercise - 02/17/17 0001      Vestibular Treatment/Exercise   Vestibular Treatment Provided  Canalith Repositioning;Habituation    Canalith Repositioning  Epley Manuever Right 3x    Habituation Exercises  Horizontal Roll;Comment       EPLEY MANUEVER RIGHT   Number of Reps   3    Overall Response  Improved Symptoms      Horizontal Roll   Number of Reps   5 head turns to right while lying down followed by rolls to RT              PT Short Term Goals - 01/20/17 1323      PT SHORT TERM GOAL #1   Title  Pt to be experiencing vertigo 3 times or less in a 24 hour period.     Time  2    Period  Weeks    Status  New    Target Date  02/03/17      PT SHORT TERM GOAL #2   Title  Pt to verbalize  that her headaches have decreased in intensity to no greater than a 4/10     Time  2    Period  Weeks    Status  New    Target Date  01/20/17        PT Long Term Goals - 01/20/17 1325      PT LONG TERM GOAL #1   Title  PT to verbalize that she has not experienced a headace for the past three days     Time  4    Period  Weeks    Status  New    Target Date  02/17/17      PT LONG TERM GOAL #2   Title  Pt to state that she has not experienced any sx of vertigo for the past week     Time  4    Period  Weeks    Status  New            Plan - 02/17/17 1035    Clinical Impression Statement  Decreased symptoms after halpike dix manuever but pt is now having sx with Lt supine to sidelying.  Given this as a habituation exercises.  After manual when pt came supine to Lt sidelying to sitting pt had significant dizziness but not spinning.  Will check positional BP next treatment.     Rehab Potential  Good    PT Frequency  2x / week    PT Duration  4 weeks    PT Treatment/Interventions  Manual techniques;Patient/family education;Neuromuscular re-education    PT Next Visit Plan  check postitional BP.      PT Home Exercise Plan  Eval:   head turns and eye exercies.; tandem stance with eye/head movements and SLS.         Patient will benefit from skilled therapeutic intervention in order to improve the following deficits and impairments:  Pain, Dizziness  Visit Diagnosis: BPPV (benign paroxysmal positional vertigo), right  New daily persistent headache     Problem List Patient Active Problem List   Diagnosis Date Noted  . Hyperlipidemia 07/17/2016  . Hypothyroid 07/17/2016  . Allergic rhinitis 07/17/2016  . GERD (gastroesophageal reflux disease) 07/17/2016  . Localized edema 07/17/2016  . Intracranial arachnoid cyst 06/03/2015  . Mallet deformity of third finger, right acquired 05/07/2015  . Primary osteoarthritis of first carpometacarpal joint of right hand 05/07/2015  . Pain in joint, pelvic region and thigh 02/03/2011    Rayetta Humphrey, PT CLT [phone] 02/17/2017, 10:38 AM  [place], [place], [address] Phone: [phone]   Fax:  [phone]  Name: Paige Jones MRN: [id number] Date of Birth: [birth year]

## 2017-02-19 ENCOUNTER — Telehealth (HOSPITAL_COMMUNITY): Payer: Self-pay | Admitting: Family Medicine

## 2017-02-19 NOTE — Telephone Encounter (Signed)
02/19/17  Pt called to cancel her 12/3 appt.  Said she wouldn't be coming

## 2017-02-22 ENCOUNTER — Encounter (HOSPITAL_COMMUNITY): Payer: PPO | Admitting: Physical Therapy

## 2017-02-24 ENCOUNTER — Encounter (HOSPITAL_COMMUNITY): Payer: Self-pay | Admitting: Physical Therapy

## 2017-02-24 ENCOUNTER — Ambulatory Visit (HOSPITAL_COMMUNITY): Payer: PPO | Attending: Neurology | Admitting: Physical Therapy

## 2017-02-24 DIAGNOSIS — H8111 Benign paroxysmal vertigo, right ear: Secondary | ICD-10-CM | POA: Diagnosis not present

## 2017-02-24 DIAGNOSIS — G4452 New daily persistent headache (NDPH): Secondary | ICD-10-CM | POA: Diagnosis not present

## 2017-02-24 DIAGNOSIS — M20011 Mallet finger of right finger(s): Secondary | ICD-10-CM | POA: Insufficient documentation

## 2017-02-24 NOTE — Therapy (Signed)
[place], [place], [address] Phone: [phone]   Fax:  [phone]  Physical Therapy Treatment  Patient Details  Name: Paige Jones MRN: [id number] Date of Birth: January 12, 1951 Referring Provider: Dr. Metta Clines   Encounter Date: 02/24/2017  PT End of Session - 02/24/17 1118    Visit Number  7    Number of Visits  8    Date for PT Re-Evaluation  02/19/17    Authorization Type  [employer]    Authorization - Visit Number  7    Authorization - Number of Visits  8    PT Start Time  4944    PT Stop Time  1118    PT Time Calculation (min)  42 min    Activity Tolerance  Patient tolerated treatment well    Behavior During Therapy  [facility] for tasks assessed/performed       Past Medical History:  Diagnosis Date  . Arachnoid cyst   . Arthritis   . Atrial fibrillation ([place])   . Basal cell carcinoma   . Hyperlipidemia   . Hypertension   . Thyroid goiter     Past Surgical History:  Procedure Laterality Date  . BASAL CELL CARCINOMA EXCISION      There were no vitals filed for this visit.  Subjective Assessment - 02/24/17 1036    Subjective  Pt states that she is doing better.  She has even decorated.  She is not feeling the vertigo sensation any longer     Pertinent History  HTN, arthritis, A-Fib, arachonid cyst, CA    Patient Stated Goals  to be in better balance, less headaches and dizziness, cataracts     Currently in Pain?  No/denies                      [facility] Adult PT Treatment/Exercise - 02/24/17 0001      Manual Therapy   Manual Therapy  Joint mobilization;Soft tissue mobilization;Manual Traction    Manual therapy comments  Manual complete separate than rest of tx    Joint Mobilization  grade II/III    Soft tissue mobilization  supine position with LE elevated STM to upper traps with position release technique and suboccipital release technqiues 2x    Manual Traction  30" x 4        Vestibular Treatment/Exercise - 02/24/17 0001      Vestibular Treatment/Exercise   Habituation Exercises  Horizontal Roll      Eye/Head Exercise Horizontal   Foot Position  -- supine head turns x 5 B no sx         Balance Exercises - 02/24/17 1111      Balance Exercises: Standing   Tandem Stance  Eyes closed;Foam/compliant surface    Tandem Gait  Forward;2 reps with head turns then 2 RT with head nods     Other Standing Exercises  Warrior I x 1' B; warrior III x 30 seconds x 2 B           PT Short Term Goals - 01/20/17 1323      PT SHORT TERM GOAL #1   Title  Pt to be experiencing vertigo 3 times or less in a 24 hour period.     Time  2    Period  Weeks    Status  New    Target Date  02/03/17      PT SHORT TERM GOAL #2  Title  Pt to verbalize that her headaches have decreased in intensity to no greater than a 4/10     Time  2    Period  Weeks    Status  New    Target Date  01/20/17        PT Long Term Goals - 01/20/17 1325      PT LONG TERM GOAL #1   Title  PT to verbalize that she has not experienced a headace for the past three days     Time  4    Period  Weeks    Status  New    Target Date  02/17/17      PT LONG TERM GOAL #2   Title  Pt to state that she has not experienced any sx of vertigo for the past week     Time  4    Period  Weeks    Status  New            Plan - 02/24/17 1119    Clinical Impression Statement  Began session with BP which was 120/80 due to pt not having any sx we did not do positional BP this treatment.  Resumed balance exercises which pt did well on but noted imbalance with head nods.  Therapist suggested that pt complete these frequently until next session.  Ended sesion with manual with mild to moderate tension noted.      Rehab Potential  Good    PT Frequency  2x / week    PT Duration  4 weeks    PT Treatment/Interventions  Manual techniques;Patient/family education;Neuromuscular re-education    PT Next  Visit Plan  Reassess possible positional BP.     PT Home Exercise Plan  Eval:  head turns and eye exercies.; tandem stance with eye/head movements and SLS.         Patient will benefit from skilled therapeutic intervention in order to improve the following deficits and impairments:  Pain, Dizziness  Visit Diagnosis: BPPV (benign paroxysmal positional vertigo), right  New daily persistent headache     Problem List Patient Active Problem List   Diagnosis Date Noted  . Hyperlipidemia 07/17/2016  . Hypothyroid 07/17/2016  . Allergic rhinitis 07/17/2016  . GERD (gastroesophageal reflux disease) 07/17/2016  . Localized edema 07/17/2016  . Intracranial arachnoid cyst 06/03/2015  . Mallet deformity of third finger, right acquired 05/07/2015  . Primary osteoarthritis of first carpometacarpal joint of right hand 05/07/2015  . Pain in joint, pelvic region and thigh 02/03/2011   Rayetta Humphrey, PT CLT [phone] 02/24/2017, 11:22 AM  [place], [place], [address] Phone: [phone]   Fax:  [phone]  Name: STAPHANY DITTON MRN: [id number] Date of Birth: [birth year]

## 2017-03-01 ENCOUNTER — Ambulatory Visit (HOSPITAL_COMMUNITY): Payer: PPO | Admitting: Physical Therapy

## 2017-03-01 ENCOUNTER — Other Ambulatory Visit: Payer: Self-pay | Admitting: Family Medicine

## 2017-03-01 DIAGNOSIS — E785 Hyperlipidemia, unspecified: Secondary | ICD-10-CM

## 2017-03-02 ENCOUNTER — Telehealth (HOSPITAL_COMMUNITY): Payer: Self-pay | Admitting: Family Medicine

## 2017-03-02 DIAGNOSIS — H25013 Cortical age-related cataract, bilateral: Secondary | ICD-10-CM | POA: Diagnosis not present

## 2017-03-02 DIAGNOSIS — H2513 Age-related nuclear cataract, bilateral: Secondary | ICD-10-CM | POA: Diagnosis not present

## 2017-03-02 DIAGNOSIS — H18411 Arcus senilis, right eye: Secondary | ICD-10-CM | POA: Diagnosis not present

## 2017-03-02 DIAGNOSIS — H2511 Age-related nuclear cataract, right eye: Secondary | ICD-10-CM | POA: Diagnosis not present

## 2017-03-02 DIAGNOSIS — H25043 Posterior subcapsular polar age-related cataract, bilateral: Secondary | ICD-10-CM | POA: Diagnosis not present

## 2017-03-02 NOTE — Telephone Encounter (Signed)
03/02/17  Patient had no other appointments scheduled.  I left a message to see if she wanted to reschedule the 12/10 appt and make more.

## 2017-03-03 NOTE — Telephone Encounter (Signed)
Please advise last OV 07/15/16, pt had a medicare wellness with [facility] on 01/04/17   Please advise?

## 2017-03-10 ENCOUNTER — Encounter (HOSPITAL_COMMUNITY): Payer: Self-pay | Admitting: Physical Therapy

## 2017-03-10 ENCOUNTER — Ambulatory Visit (HOSPITAL_COMMUNITY): Payer: PPO | Admitting: Physical Therapy

## 2017-03-10 DIAGNOSIS — H8111 Benign paroxysmal vertigo, right ear: Secondary | ICD-10-CM

## 2017-03-10 DIAGNOSIS — G4452 New daily persistent headache (NDPH): Secondary | ICD-10-CM

## 2017-03-10 DIAGNOSIS — M20011 Mallet finger of right finger(s): Secondary | ICD-10-CM

## 2017-03-10 NOTE — Therapy (Signed)
[place], [place], [address] Phone: [phone]   Fax:  [phone]  Physical Therapy Treatment  Patient Details  Name: Paige Jones MRN: [id number] Date of Birth: 15-Nov-1950 Referring Provider: Dr. Metta Clines    Encounter Date: 03/10/2017  PT End of Session - 03/10/17 1206    Visit Number  8    Number of Visits  8    Date for PT Re-Evaluation  02/19/17    Authorization Type  [employer]    Authorization - Visit Number  8    Authorization - Number of Visits  8    PT Start Time  1120    PT Stop Time  1200    PT Time Calculation (min)  40 min    Activity Tolerance  Patient tolerated treatment well    Behavior During Therapy  [facility] for tasks assessed/performed       Past Medical History:  Diagnosis Date  . Arachnoid cyst   . Arthritis   . Atrial fibrillation ([place])   . Basal cell carcinoma   . Hyperlipidemia   . Hypertension   . Thyroid goiter     Past Surgical History:  Procedure Laterality Date  . BASAL CELL CARCINOMA EXCISION      There were no vitals filed for this visit.  Subjective Assessment - 03/10/17 1203    Subjective  Pt states that she is doing pretty good.  She has not had vertigo for some time.  She has noticed that after she works in the yard the next day she has increased headaches.      Pertinent History  HTN, arthritis, A-Fib, arachonid cyst, CA    Patient Stated Goals  to be in better balance, less headaches and dizziness, cataracts     Currently in Pain?  Yes    Pain Score  2     Pain Onset  More than a month ago         [facility] PT Assessment - 03/10/17 0001      Assessment   Medical Diagnosis  BPPV    Referring Provider  Dr. Metta Clines     Onset Date/Surgical Date  12/21/16    Next MD Visit  not scheduled    Prior Therapy  none      Precautions   Precautions  None      Restrictions   Weight Bearing Restrictions  No      Balance Screen   Has the patient  fallen in the past 6 months  No    Has the patient had a decrease in activity level because of a fear of falling?   No    Is the patient reluctant to leave their home because of a fear of falling?   No      Home Film/video editor residence      Prior Function   Level of Independence  Independent      Cognition   Overall Cognitive Status  Within Functional Limits for tasks assessed      AROM   Cervical Flexion  wfl    Cervical Extension  wfl    Cervical - Right Side Bend  wfl    Cervical - Left Side Bend  wfl    Cervical - Right Rotation  wfl    Cervical - Left Rotation  wfl         Vestibular Assessment - 03/10/17 0001  Vestibular Assessment   General Observation  normal      Symptom Behavior   Type of Dizziness  Imbalance during day; room spinning at night     Frequency of Dizziness  3-4 times day and night     Duration of Dizziness  few seconds     Aggravating Factors  Supine to sit;Comment turning quickly is the what causes the symptoms the most     Relieving Factors  Rest      Occulomotor Exam   Occulomotor Alignment  Normal    Head shaking Horizontal  Absent    Head Shaking Vertical  Absent    Smooth Pursuits  Saccades    Saccades  Intact didi have poor trajectory     Comment  --      Vestibulo-Occular Reflex   VOR 1 Head Only (x 1 viewing)  --    VOR 2 Head and Object (x 2 viewing)  -- good       Positional Testing   Dix-Hallpike  Dix-Hallpike Right;Dix-Hallpike Left      Dix-Hallpike Right   Dix-Hallpike Right Duration  --    Dix-Hallpike Right Symptoms  No nystagmus      Dix-Hallpike Left   Dix-Hallpike Left Symptoms  No nystagmus      Positional Sensitivities   Right Hallpike  Mild dizziness    Up from Right Hallpike  Moderate dizziness    Up from Left Hallpike  Lightheadedness              [facility] Adult PT Treatment/Exercise - 03/10/17 0001      Manual Therapy   Manual Therapy  Joint mobilization;Soft  tissue mobilization;Manual Traction    Manual therapy comments  Manual complete separate than rest of tx    Joint Mobilization  grade II/III    Soft tissue mobilization  supine position with LE elevated STM to upper traps with position release technique and suboccipital release technqiues 2x    Manual Traction  30" x 4                PT Short Term Goals - 03/10/17 1132      PT SHORT TERM GOAL #1   Title  Pt to be experiencing vertigo 3 times or less in a 24 hour period.     Time  2    Period  Weeks    Status  Achieved      PT SHORT TERM GOAL #2   Title  Pt to verbalize that her headaches have decreased in intensity to no greater than a 4/10     Time  2    Period  Weeks    Status  Achieved        PT Long Term Goals - 03/10/17 1133      PT LONG TERM GOAL #1   Title  PT to verbalize that she has not experienced a headace for the past three days     Time  4    Period  Weeks    Status  On-going      PT LONG TERM GOAL #2   Title  Pt to state that she has not experienced any sx of vertigo for the past week     Time  4    Period  Weeks    Status  Achieved            Plan - 03/10/17 1207    Clinical Impression Statement  Pt continues not to have  vertigo sx.  Currently she will occasionally feel " funny" in her head with quick turns and has headaches after working in the yard but even these sx's are bettter.  Pt is I in self cannalith manuevering.  Mild mm spasms noted with manual.  Therapist verbalized that going to a massage therapist on a regular basis may be beneficial; patient agreed.;     Rehab Potential  Good    PT Frequency  2x / week    PT Duration  4 weeks    PT Treatment/Interventions  Manual techniques;Patient/family education;Neuromuscular re-education    PT Next Visit Plan  Discharge due to maximizing effectiviness of treatment.     PT Home Exercise Plan  Eval:  head turns and eye exercies.; tandem stance with eye/head movements and SLS.          Patient will benefit from skilled therapeutic intervention in order to improve the following deficits and impairments:  Pain, Dizziness  Visit Diagnosis: BPPV (benign paroxysmal positional vertigo), right  New daily persistent headache  Mallet finger, acquired, right   G-Codes - 03-30-17 1210    Functional Assessment Tool Used (Outpatient Only)  clinical judgement;  frequency and severity of dizziness and headaches.     Functional Limitation  Other PT primary    Other PT Primary Goal Status (Z2080)  At least 1 percent but less than 20 percent impaired, limited or restricted    Other PT Primary Discharge Status [id number])  At least 1 percent but less than 20 percent impaired, limited or restricted       Problem List Patient Active Problem List   Diagnosis Date Noted  . Hyperlipidemia 07/17/2016  . Hypothyroid 07/17/2016  . Allergic rhinitis 07/17/2016  . GERD (gastroesophageal reflux disease) 07/17/2016  . Localized edema 07/17/2016  . Intracranial arachnoid cyst 06/03/2015  . Mallet deformity of third finger, right acquired 05/07/2015  . Primary osteoarthritis of first carpometacarpal joint of right hand 05/07/2015  . Pain in joint, pelvic region and thigh 02/03/2011    Rayetta Humphrey, PT CLT [phone] March 30, 2017, 12:12 PM  [facility] [address] [place], [place], [address] Phone: [phone]   Fax:  [phone]  Name: Paige Jones MRN: [id number] Date of Birth: 1950/05/11  PHYSICAL THERAPY DISCHARGE SUMMARY  Visits from Start of Care: 8  Current functional level related to goals / functional outcomes: See above    Remaining deficits: See above   Education / Equipment: HEP  Plan: Patient agrees to discharge.  Patient goals were partially met. Patient is being discharged due to lack of progress.  ?????        Pt is improved over 50% but still having some symptoms.      Rayetta Humphrey, [place]  CLT [phone]

## 2017-03-24 DIAGNOSIS — H9313 Tinnitus, bilateral: Secondary | ICD-10-CM | POA: Insufficient documentation

## 2017-03-24 DIAGNOSIS — R42 Dizziness and giddiness: Secondary | ICD-10-CM | POA: Diagnosis not present

## 2017-03-24 DIAGNOSIS — H903 Sensorineural hearing loss, bilateral: Secondary | ICD-10-CM | POA: Diagnosis not present

## 2017-03-30 DIAGNOSIS — R51 Headache: Secondary | ICD-10-CM | POA: Diagnosis not present

## 2017-03-30 DIAGNOSIS — J309 Allergic rhinitis, unspecified: Secondary | ICD-10-CM | POA: Diagnosis not present

## 2017-03-30 DIAGNOSIS — J3081 Allergic rhinitis due to animal (cat) (dog) hair and dander: Secondary | ICD-10-CM | POA: Diagnosis not present

## 2017-03-30 DIAGNOSIS — J3089 Other allergic rhinitis: Secondary | ICD-10-CM | POA: Diagnosis not present

## 2017-04-09 DIAGNOSIS — H2511 Age-related nuclear cataract, right eye: Secondary | ICD-10-CM | POA: Diagnosis not present

## 2017-04-09 DIAGNOSIS — H2512 Age-related nuclear cataract, left eye: Secondary | ICD-10-CM | POA: Diagnosis not present

## 2017-04-09 DIAGNOSIS — H2513 Age-related nuclear cataract, bilateral: Secondary | ICD-10-CM | POA: Diagnosis not present

## 2017-04-20 DIAGNOSIS — J342 Deviated nasal septum: Secondary | ICD-10-CM | POA: Diagnosis not present

## 2017-04-20 DIAGNOSIS — R42 Dizziness and giddiness: Secondary | ICD-10-CM | POA: Diagnosis not present

## 2017-04-20 DIAGNOSIS — R51 Headache: Secondary | ICD-10-CM | POA: Diagnosis not present

## 2017-04-20 DIAGNOSIS — J343 Hypertrophy of nasal turbinates: Secondary | ICD-10-CM | POA: Diagnosis not present

## 2017-04-20 DIAGNOSIS — H9313 Tinnitus, bilateral: Secondary | ICD-10-CM | POA: Diagnosis not present

## 2017-04-20 DIAGNOSIS — J310 Chronic rhinitis: Secondary | ICD-10-CM | POA: Diagnosis not present

## 2017-04-23 DIAGNOSIS — H2513 Age-related nuclear cataract, bilateral: Secondary | ICD-10-CM | POA: Diagnosis not present

## 2017-04-23 DIAGNOSIS — H2512 Age-related nuclear cataract, left eye: Secondary | ICD-10-CM | POA: Diagnosis not present

## 2017-04-30 ENCOUNTER — Ambulatory Visit: Payer: PPO | Admitting: Neurology

## 2017-04-30 ENCOUNTER — Encounter: Payer: Self-pay | Admitting: Neurology

## 2017-04-30 VITALS — BP 110/64 | HR 74 | Ht 62.0 in | Wt 146.0 lb

## 2017-04-30 DIAGNOSIS — G4486 Cervicogenic headache: Secondary | ICD-10-CM

## 2017-04-30 DIAGNOSIS — H8110 Benign paroxysmal vertigo, unspecified ear: Secondary | ICD-10-CM | POA: Diagnosis not present

## 2017-04-30 DIAGNOSIS — R51 Headache: Secondary | ICD-10-CM | POA: Diagnosis not present

## 2017-04-30 DIAGNOSIS — G930 Cerebral cysts: Secondary | ICD-10-CM

## 2017-04-30 MED ORDER — GABAPENTIN 100 MG PO CAPS: 100.0000 mg | ORAL_CAPSULE | Freq: Every day | ORAL | 0 refills | Status: DC

## 2017-04-30 NOTE — Patient Instructions (Signed)
1.  Stop cyclobenzaprine for now. 2.  Start gabapentin 100mg  at bedtime.  If headaches/neck pain not improved in 2 weeks, you may increase dose to 2 capsules (200mg ) at bedtime.  Contact me when you need a refill and we can continue current dose or increase dose again. 3.  Follow up in 3 months.

## 2017-04-30 NOTE — Progress Notes (Signed)
NEUROLOGY FOLLOW UP OFFICE NOTE  [name] [id number]  HISTORY OF PRESENT ILLNESS: Paige Jones is a 67 year old right-handed female whom I previously saw for an incidental intracranial arachnoid cyst who follows up for headache and BPPV.  UPDATE: As these were new onset persistent headaches, MRI of brain with and without contrast was performed on 01/31/17, which was personally reviewed and again demonstrated stable 3 cm left middle cranial fossa arachnoid cyst but no new or acute abnormalities.  Last visit, she was given a prednisone taper to break the intractable headache, which was ineffective.  She was prescribed nortriptyline 25mg  at bedtime but never started it due to fear of possible side effects.  She takes cyclobenzaprine 5mg  at bedtime (10mg  makes her too drowsy).  She was sent to PT for neck pain and vestibular rehab Vertigo resolved.  PT provided temporary relief of headache and neck pain.  She saw ENT for the vertigo.  Audiogram indicated high-frequency sensorineural hearing loss bilaterally, slightly worse on the left.   HISTORY: She developed new headaches in October 2018.  She started experiencing dizziness and palpitations.  She presented to the ED on 12/27/16 and was found to be in atrial fibrillation.  She was started on diltiazem and Xarelto, which was later changed to Eliquis due to dizziness.  Headaches are in the occipital and temporal regions bilaterally.  It is not a thunderclap headache.  They are mild-moderate and a constant non-throbbing pressure.  There are no associated symptoms such as nausea, vomiting, photophobia or visual disturbance.  Home sleep study from 01/04/17 was normal.  There are no exacerbating or relieving factors.  She takes Tylenol.  She also has brief episodes of spinning sensation, which is positional, such as bending forward or turning in bed.  It lasts up to a minute.  She also has blurred vision since cataract surgery.   She has an MRI  of the brain performed on 05/30/14, which showed 2.8 cm arachnoid cyst in the left middle cranial fossa, adjacent to the temporal lobe.  Repeat MRI of brain with and without contrast from 06/11/15 demonstrated stable 2.99 cm arachnoid cyst with mild mass effect but no associated signal abnormality.  PAST MEDICAL HISTORY: Past Medical History:  Diagnosis Date  . Arachnoid cyst   . Arthritis   . Atrial fibrillation ([place])   . Basal cell carcinoma   . Hyperlipidemia   . Hypertension   . Thyroid goiter     MEDICATIONS: Current Outpatient Medications on File Prior to Visit  Medication Sig Dispense Refill  . azelastine (OPTIVAR) 0.05 % ophthalmic solution 1 drop 2 (two) times daily.    Marland Kitchen atorvastatin (LIPITOR) 20 MG tablet TAKE ONE TABLET BY MOUTH ONCE DAILY. 30 tablet 6  . Calcium Carbonate-Vit D-Min (CALCIUM 1200 PO) Take by mouth.    . CETIRIZINE HCL PO Take 1 tablet by mouth at bedtime as needed.     . diclofenac sodium (VOLTAREN) 1 % GEL Apply 4 g topically 4 (four) times daily. 100 g 3  . ELIQUIS 5 MG TABS tablet Take 5 mg by mouth daily.    Marland Kitchen estradiol (VIVELLE-DOT) 0.0375 MG/24HR Apply patch twice weekly    . fenofibrate 160 MG tablet TAKE ONE TABLET BY MOUTH ONCE DAILY. 90 tablet 0  . flecainide (TAMBOCOR) 100 MG tablet Take 100 mg by mouth as needed.    . furosemide (LASIX) 20 MG tablet TAKE (1) TABLET BY MOUTH ONCE DAILY. 30 tablet 6  .  levocetirizine (XYZAL) 5 MG tablet Take 5 mg by mouth daily.    Marland Kitchen levothyroxine (SYNTHROID, LEVOTHROID) 75 MCG tablet Take 75 mcg by mouth.    . metoprolol tartrate (LOPRESSOR) 50 MG tablet Take 50 mg by mouth as needed.    . montelukast (SINGULAIR) 10 MG tablet Take 10 mg by mouth daily.    . nortriptyline (PAMELOR) 25 MG capsule Take 1 capsule (25 mg total) by mouth at bedtime. 30 capsule 3  . Omega-3 Fatty Acids (FISH OIL) 1000 MG CAPS Take by mouth.    Marland Kitchen omeprazole (PRILOSEC) 20 MG capsule TAKE (1) CAPSULE BY MOUTH ONCE DAILY. 30 capsule 6  .  predniSONE (DELTASONE) 10 MG tablet Take 6tabs x1day, then 5tabs x1day, then 4tabs x1day, then 3tabs x1day, then 2tabs x1day, then 1tab x1day, then STOP 21 tablet 0  . rivaroxaban (XARELTO) 20 MG TABS tablet Take 1 tablet (20 mg total) by mouth daily with supper. 30 tablet 0  . vitamin E (VITAMIN E) 400 UNIT capsule 400 Units.     No current facility-administered medications on file prior to visit.     ALLERGIES: Allergies  Allergen Reactions  . Pseudoephedrine     Other reaction(s): Other Elevated BP  . Rosuvastatin     Other reaction(s): Other headache    FAMILY HISTORY: Family History  Problem Relation Age of Onset  . Thyroid disease Mother   . Atrial fibrillation Mother   . Fibromyalgia Mother   . Arthritis Mother   . Emphysema Father   . Diabetes Father   . Heart disease Father   . Cancer Father        gall bladder    SOCIAL HISTORY: Social History   Socioeconomic History  . Marital status: Married    Spouse name: Not on file  . Number of children: Not on file  . Years of education: Not on file  . Highest education level: Not on file  Social Needs  . Financial resource strain: Not on file  . Food insecurity - worry: Not on file  . Food insecurity - inability: Not on file  . Transportation needs - medical: Not on file  . Transportation needs - non-medical: Not on file  Occupational History  . Not on file  Tobacco Use  . Smoking status: Never Smoker  . Smokeless tobacco: Never Used  Substance and Sexual Activity  . Alcohol use: No    Alcohol/week: 0.0 oz  . Drug use: No  . Sexual activity: Not on file  Other Topics Concern  . Not on file  Social History Narrative  . Not on file    REVIEW OF SYSTEMS: Constitutional: No fevers, chills, or sweats, no generalized fatigue, change in appetite Eyes: No visual changes, double vision, eye pain Ear, nose and throat: No hearing loss, ear pain, nasal congestion, sore throat Cardiovascular: No chest pain,  palpitations Respiratory:  No shortness of breath at rest or with exertion, wheezes GastrointestinaI: No nausea, vomiting, diarrhea, abdominal pain, fecal incontinence Genitourinary:  No dysuria, urinary retention or frequency Musculoskeletal:  Neck pain Integumentary: No rash, pruritus, skin lesions Neurological: as above Psychiatric: No depression, insomnia, anxiety Endocrine: No palpitations, fatigue, diaphoresis, mood swings, change in appetite, change in weight, increased thirst Hematologic/Lymphatic:  No purpura, petechiae. Allergic/Immunologic: no itchy/runny eyes, nasal congestion, recent allergic reactions, rashes  PHYSICAL EXAM: Vitals:   04/30/17 1455  BP: 110/64  Pulse: 74  SpO2: 97%   General: No acute distress.  Patient appears well-groomed.  Head:  Normocephalic/atraumatic Eyes:  Fundi examined but not visualized Neck: supple, bilateral paraspinal tenderness, full range of motion Heart:  Regular rate and rhythm Lungs:  Clear to auscultation bilaterally Back: No paraspinal tenderness Neurological Exam: alert and oriented to person, place, and time. Attention span and concentration intact, recent and remote memory intact, fund of knowledge intact.  Speech fluent and not dysarthric, language intact.  CN II-XII intact. Bulk and tone normal, muscle strength 5/5 throughout.  Sensation to light touch  intact.  Deep tendon reflexes 2+ throughout.  Finger to nose testing intact.  Gait normal  IMPRESSION: Possible cervicogenic headache BPPV resolved Arachnoid cyst  PLAN: 1.  She will start gabapentin 100mg  at bedtime and may increase dose to 200mg  at bedtime in 2 weeks if needed/tolerated. 2.  Stop cyclobenzaprine for now. 3.  Recommended turmeric 500mg  once or twice daily 4.  Follow up in 3 months.  25 minutes spent face to face with patient, over 50% spent discussing management.  Metta Clines, DO  CC:  Anastasia Pall, MD

## 2017-05-31 DIAGNOSIS — D1801 Hemangioma of skin and subcutaneous tissue: Secondary | ICD-10-CM | POA: Diagnosis not present

## 2017-05-31 DIAGNOSIS — Z85828 Personal history of other malignant neoplasm of skin: Secondary | ICD-10-CM | POA: Diagnosis not present

## 2017-05-31 DIAGNOSIS — D225 Melanocytic nevi of trunk: Secondary | ICD-10-CM | POA: Diagnosis not present

## 2017-05-31 DIAGNOSIS — L821 Other seborrheic keratosis: Secondary | ICD-10-CM | POA: Diagnosis not present

## 2017-05-31 DIAGNOSIS — L814 Other melanin hyperpigmentation: Secondary | ICD-10-CM | POA: Diagnosis not present

## 2017-05-31 DIAGNOSIS — L570 Actinic keratosis: Secondary | ICD-10-CM | POA: Diagnosis not present

## 2017-06-10 DIAGNOSIS — Z1231 Encounter for screening mammogram for malignant neoplasm of breast: Secondary | ICD-10-CM | POA: Diagnosis not present

## 2017-06-10 DIAGNOSIS — E039 Hypothyroidism, unspecified: Secondary | ICD-10-CM | POA: Diagnosis not present

## 2017-06-10 DIAGNOSIS — N958 Other specified menopausal and perimenopausal disorders: Secondary | ICD-10-CM | POA: Diagnosis not present

## 2017-06-10 DIAGNOSIS — Z6827 Body mass index (BMI) 27.0-27.9, adult: Secondary | ICD-10-CM | POA: Diagnosis not present

## 2017-06-10 DIAGNOSIS — Z01419 Encounter for gynecological examination (general) (routine) without abnormal findings: Secondary | ICD-10-CM | POA: Diagnosis not present

## 2017-06-17 DIAGNOSIS — E039 Hypothyroidism, unspecified: Secondary | ICD-10-CM | POA: Diagnosis not present

## 2017-06-17 DIAGNOSIS — E049 Nontoxic goiter, unspecified: Secondary | ICD-10-CM | POA: Diagnosis not present

## 2017-06-17 DIAGNOSIS — I10 Essential (primary) hypertension: Secondary | ICD-10-CM | POA: Diagnosis not present

## 2017-06-18 ENCOUNTER — Other Ambulatory Visit: Payer: Self-pay | Admitting: Endocrinology

## 2017-06-18 DIAGNOSIS — E049 Nontoxic goiter, unspecified: Secondary | ICD-10-CM

## 2017-06-26 ENCOUNTER — Other Ambulatory Visit: Payer: Self-pay | Admitting: Family Medicine

## 2017-07-05 DIAGNOSIS — E782 Mixed hyperlipidemia: Secondary | ICD-10-CM | POA: Diagnosis not present

## 2017-07-05 DIAGNOSIS — E038 Other specified hypothyroidism: Secondary | ICD-10-CM | POA: Diagnosis not present

## 2017-07-05 DIAGNOSIS — J3089 Other allergic rhinitis: Secondary | ICD-10-CM | POA: Diagnosis not present

## 2017-07-05 DIAGNOSIS — I480 Paroxysmal atrial fibrillation: Secondary | ICD-10-CM | POA: Diagnosis not present

## 2017-07-05 DIAGNOSIS — J3081 Allergic rhinitis due to animal (cat) (dog) hair and dander: Secondary | ICD-10-CM | POA: Diagnosis not present

## 2017-07-13 DIAGNOSIS — E782 Mixed hyperlipidemia: Secondary | ICD-10-CM | POA: Diagnosis not present

## 2017-07-22 ENCOUNTER — Other Ambulatory Visit: Payer: Self-pay | Admitting: Family Medicine

## 2017-07-27 DIAGNOSIS — J209 Acute bronchitis, unspecified: Secondary | ICD-10-CM | POA: Diagnosis not present

## 2017-08-09 DIAGNOSIS — E785 Hyperlipidemia, unspecified: Secondary | ICD-10-CM | POA: Diagnosis not present

## 2017-08-09 DIAGNOSIS — E7849 Other hyperlipidemia: Secondary | ICD-10-CM | POA: Diagnosis not present

## 2017-08-09 DIAGNOSIS — Z8249 Family history of ischemic heart disease and other diseases of the circulatory system: Secondary | ICD-10-CM | POA: Diagnosis not present

## 2017-08-09 DIAGNOSIS — E039 Hypothyroidism, unspecified: Secondary | ICD-10-CM | POA: Diagnosis not present

## 2017-08-09 DIAGNOSIS — E663 Overweight: Secondary | ICD-10-CM | POA: Diagnosis not present

## 2017-08-09 DIAGNOSIS — I480 Paroxysmal atrial fibrillation: Secondary | ICD-10-CM | POA: Diagnosis not present

## 2017-08-10 ENCOUNTER — Ambulatory Visit: Payer: PPO | Admitting: Neurology

## 2017-11-06 ENCOUNTER — Other Ambulatory Visit: Payer: Self-pay | Admitting: Family Medicine

## 2017-11-09 DIAGNOSIS — E782 Mixed hyperlipidemia: Secondary | ICD-10-CM | POA: Diagnosis not present

## 2017-11-09 DIAGNOSIS — E038 Other specified hypothyroidism: Secondary | ICD-10-CM | POA: Diagnosis not present

## 2017-11-09 DIAGNOSIS — N951 Menopausal and female climacteric states: Secondary | ICD-10-CM | POA: Diagnosis not present

## 2017-11-17 DIAGNOSIS — R7309 Other abnormal glucose: Secondary | ICD-10-CM | POA: Diagnosis not present

## 2017-11-17 DIAGNOSIS — E038 Other specified hypothyroidism: Secondary | ICD-10-CM | POA: Diagnosis not present

## 2017-11-17 DIAGNOSIS — E782 Mixed hyperlipidemia: Secondary | ICD-10-CM | POA: Diagnosis not present

## 2017-12-24 DIAGNOSIS — M542 Cervicalgia: Secondary | ICD-10-CM | POA: Diagnosis not present

## 2017-12-24 DIAGNOSIS — M5032 Other cervical disc degeneration, mid-cervical region, unspecified level: Secondary | ICD-10-CM | POA: Diagnosis not present

## 2017-12-24 DIAGNOSIS — M47812 Spondylosis without myelopathy or radiculopathy, cervical region: Secondary | ICD-10-CM | POA: Diagnosis not present

## 2018-01-22 DIAGNOSIS — J019 Acute sinusitis, unspecified: Secondary | ICD-10-CM | POA: Diagnosis not present

## 2018-01-22 DIAGNOSIS — B9689 Other specified bacterial agents as the cause of diseases classified elsewhere: Secondary | ICD-10-CM | POA: Diagnosis not present

## 2018-01-25 DIAGNOSIS — I480 Paroxysmal atrial fibrillation: Secondary | ICD-10-CM | POA: Diagnosis not present

## 2018-01-25 DIAGNOSIS — E7800 Pure hypercholesterolemia, unspecified: Secondary | ICD-10-CM | POA: Diagnosis not present

## 2018-01-27 DIAGNOSIS — R293 Abnormal posture: Secondary | ICD-10-CM | POA: Diagnosis not present

## 2018-01-27 DIAGNOSIS — M6281 Muscle weakness (generalized): Secondary | ICD-10-CM | POA: Diagnosis not present

## 2018-01-27 DIAGNOSIS — M5382 Other specified dorsopathies, cervical region: Secondary | ICD-10-CM | POA: Diagnosis not present

## 2018-01-27 DIAGNOSIS — M5030 Other cervical disc degeneration, unspecified cervical region: Secondary | ICD-10-CM | POA: Diagnosis not present

## 2018-01-27 DIAGNOSIS — I10 Essential (primary) hypertension: Secondary | ICD-10-CM | POA: Diagnosis not present

## 2018-01-28 DIAGNOSIS — I4891 Unspecified atrial fibrillation: Secondary | ICD-10-CM | POA: Diagnosis not present

## 2018-02-01 DIAGNOSIS — M5030 Other cervical disc degeneration, unspecified cervical region: Secondary | ICD-10-CM | POA: Diagnosis not present

## 2018-02-01 DIAGNOSIS — I10 Essential (primary) hypertension: Secondary | ICD-10-CM | POA: Diagnosis not present

## 2018-02-01 DIAGNOSIS — M6281 Muscle weakness (generalized): Secondary | ICD-10-CM | POA: Diagnosis not present

## 2018-02-01 DIAGNOSIS — R293 Abnormal posture: Secondary | ICD-10-CM | POA: Diagnosis not present

## 2018-02-01 DIAGNOSIS — M5382 Other specified dorsopathies, cervical region: Secondary | ICD-10-CM | POA: Diagnosis not present

## 2018-02-14 DIAGNOSIS — M5030 Other cervical disc degeneration, unspecified cervical region: Secondary | ICD-10-CM | POA: Diagnosis not present

## 2018-02-14 DIAGNOSIS — R293 Abnormal posture: Secondary | ICD-10-CM | POA: Diagnosis not present

## 2018-02-14 DIAGNOSIS — M6281 Muscle weakness (generalized): Secondary | ICD-10-CM | POA: Diagnosis not present

## 2018-02-14 DIAGNOSIS — I10 Essential (primary) hypertension: Secondary | ICD-10-CM | POA: Diagnosis not present

## 2018-02-14 DIAGNOSIS — M5382 Other specified dorsopathies, cervical region: Secondary | ICD-10-CM | POA: Diagnosis not present

## 2018-05-12 DIAGNOSIS — F5104 Psychophysiologic insomnia: Secondary | ICD-10-CM | POA: Diagnosis not present

## 2018-05-12 DIAGNOSIS — N951 Menopausal and female climacteric states: Secondary | ICD-10-CM | POA: Diagnosis not present

## 2018-05-12 DIAGNOSIS — E782 Mixed hyperlipidemia: Secondary | ICD-10-CM | POA: Diagnosis not present

## 2018-05-12 DIAGNOSIS — R7309 Other abnormal glucose: Secondary | ICD-10-CM | POA: Diagnosis not present

## 2018-05-20 ENCOUNTER — Encounter: Payer: Self-pay | Admitting: Cardiovascular Disease

## 2018-05-20 ENCOUNTER — Ambulatory Visit: Payer: PPO | Admitting: Cardiovascular Disease

## 2018-05-20 VITALS — BP 156/84 | HR 73 | Ht 62.0 in | Wt 169.0 lb

## 2018-05-20 DIAGNOSIS — I10 Essential (primary) hypertension: Secondary | ICD-10-CM

## 2018-05-20 DIAGNOSIS — E7800 Pure hypercholesterolemia, unspecified: Secondary | ICD-10-CM | POA: Diagnosis not present

## 2018-05-20 DIAGNOSIS — I480 Paroxysmal atrial fibrillation: Secondary | ICD-10-CM | POA: Diagnosis not present

## 2018-05-20 DIAGNOSIS — M7989 Other specified soft tissue disorders: Secondary | ICD-10-CM | POA: Diagnosis not present

## 2018-05-20 MED ORDER — DRONEDARONE HCL 400 MG PO TABS: 400.0000 mg | ORAL_TABLET | Freq: Two times a day (BID) | ORAL | 3 refills | Status: DC

## 2018-05-20 NOTE — Patient Instructions (Signed)
Medication Instructions:  STOP Flecainide  START Maltaq 400 mg twice a day on Sunday, March 1 st, 2020  Labwork: None today  Procedures/Testing: Your physician has requested that you have an echocardiogram. Echocardiography is a painless test that uses sound waves to create images of your heart. It provides your doctor with information about the size and shape of your heart and how well your heart's chambers and valves are working. This procedure takes approximately one hour. There are no restrictions for this procedure.    Follow-Up: To be determined  Any Additional Special Instructions Will Be Listed Below (If Applicable).  We have referred you to see Dr.Allred regarding possible A-fib ablation   If you need a refill on your cardiac medications before your next appointment, please call your pharmacy.

## 2018-05-20 NOTE — Progress Notes (Signed)
CARDIOLOGY CONSULT NOTE  Patient ID: Paige Jones MRN: [id number] DOB/AGE: [birth year] 68 y.o.  Admit date: (Not on file) Primary Physician: Chesley Noon, MD Referring Physician: Chesley Noon, MD  Reason for Consultation: Paroxysmal atrial fibrillation  HPI: Paige Jones is a 68 y.o. female who is being seen today for the evaluation of paroxysmal atrial fibrillation at the request of Chesley Noon, MD.   She was previously a patient of Dr. Wynonia Lawman.    She is here with her husband who sees Dr. [name].  [place] in the next several weeks.  I reviewed Dr. Thurman Coyer most recent office note dated 08/09/2017.  Her last echocardiogram was performed on 12/09/2015 and demonstrated normal left ventricular systolic function, LVEF 53%.  She had been on hormone replacement therapy but this was discontinued about a year ago.  Since then she has put on about 26 pounds.  She has not been exercising.  She generally feels unwell and has frequent hot flashes.  She said she was tested for sleep apnea in the past and sleep study was normal.  She denies exertional chest pain and shortness of breath.  She has had some hand and feet swelling.  Her blood pressure was checked last week and was 128/73.  She was diagnosed with atrial fibrillation in October 2018.  I personally reviewed the ECGs from 12/27/2016 and 12/28/2016.  She spontaneously converted to sinus rhythm at that time.  She has been taking flecainide and metoprolol as needed.  [place] her to take flecainide 50 mg daily and metoprolol tartrate 25 mg daily.  She did this for a while and then went back to taking it as needed.  She has been having more frequent episodes of palpitations lasting up to 2 hours.  She took flecainide this morning at 330.  It took about 2 hours for symptoms to resolve.   Allergies   Allergen Reactions  . Pseudoephedrine     Other reaction(s): Other Elevated BP  . Rosuvastatin     Other reaction(s): Other headache    Current Outpatient Medications  Medication Sig Dispense Refill  . apixaban (ELIQUIS) 5 MG TABS tablet Take 5 mg by mouth 2 (two) times daily.    Marland Kitchen atorvastatin (LIPITOR) 20 MG tablet TAKE ONE TABLET BY MOUTH ONCE DAILY. 30 tablet 6  . azelastine (OPTIVAR) 0.05 % ophthalmic solution 1 drop 2 (two) times daily.    . Calcium Carbonate-Vit D-Min (CALCIUM 1200 PO) Take by mouth.    . CETIRIZINE HCL PO Take 1 tablet by mouth at bedtime as needed.     . diclofenac (VOLTAREN) 75 MG EC tablet TAKE (1) TABLET BY MOUTH TWICE DAILY. 180 tablet 0  . diclofenac sodium (VOLTAREN) 1 % GEL Apply 4 g topically 4 (four) times daily. 100 g 3  . fenofibrate 160 MG tablet TAKE ONE TABLET BY MOUTH ONCE DAILY. 90 tablet 0  . flecainide (TAMBOCOR) 100 MG tablet Take 100 mg by mouth as needed.    . furosemide (LASIX) 20 MG tablet TAKE (1) TABLET BY MOUTH ONCE DAILY. 30 tablet 6  . levothyroxine (SYNTHROID, LEVOTHROID) 75 MCG tablet Take 75 mcg by mouth.    . metoprolol tartrate (LOPRESSOR) 50 MG tablet Take 50 mg by mouth as needed.    . Omega-3 Fatty Acids (FISH OIL) 1000 MG  CAPS Take by mouth.    Marland Kitchen omeprazole (PRILOSEC) 20 MG capsule TAKE (1) CAPSULE BY MOUTH ONCE DAILY. 30 capsule 6  . vitamin E (VITAMIN E) 400 UNIT capsule 400 Units.     No current facility-administered medications for this visit.     Past Medical History:  Diagnosis Date  . Arachnoid cyst   . Arthritis   . Atrial fibrillation ([place])   . Basal cell carcinoma   . Hyperlipidemia   . Hypertension   . Thyroid goiter     Past Surgical History:  Procedure Laterality Date  . BASAL CELL CARCINOMA EXCISION    . HYSTERECTOMY ABDOMINAL WITH SALPINGECTOMY    . TONSILLECTOMY AND ADENOIDECTOMY      Social History   Socioeconomic History  . Marital status: Married    Spouse name: Not on file  .  Number of children: Not on file  . Years of education: Not on file  . Highest education level: Not on file  Occupational History  . Not on file  Social Needs  . Financial resource strain: Not on file  . Food insecurity:    Worry: Not on file    Inability: Not on file  . Transportation needs:    Medical: Not on file    Non-medical: Not on file  Tobacco Use  . Smoking status: Never Smoker  . Smokeless tobacco: Never Used  Substance and Sexual Activity  . Alcohol use: No    Alcohol/week: 0.0 standard drinks  . Drug use: No  . Sexual activity: Not on file  Lifestyle  . Physical activity:    Days per week: Not on file    Minutes per session: Not on file  . Stress: Not on file  Relationships  . Social connections:    Talks on phone: Not on file    Gets together: Not on file    Attends religious service: Not on file    Active member of club or organization: Not on file    Attends meetings of clubs or organizations: Not on file    Relationship status: Not on file  . Intimate partner violence:    Fear of current or ex partner: Not on file    Emotionally abused: Not on file    Physically abused: Not on file    Forced sexual activity: Not on file  Other Topics Concern  . Not on file  Social History Narrative  . Not on file     Family history: Brother died of an MI in his 71s.  Mother has atrial fibrillation.  Current Meds  Medication Sig  . apixaban (ELIQUIS) 5 MG TABS tablet Take 5 mg by mouth 2 (two) times daily.  Marland Kitchen atorvastatin (LIPITOR) 20 MG tablet TAKE ONE TABLET BY MOUTH ONCE DAILY.  Marland Kitchen azelastine (OPTIVAR) 0.05 % ophthalmic solution 1 drop 2 (two) times daily.  . Calcium Carbonate-Vit D-Min (CALCIUM 1200 PO) Take by mouth.  . CETIRIZINE HCL PO Take 1 tablet by mouth at bedtime as needed.   . diclofenac (VOLTAREN) 75 MG EC tablet TAKE (1) TABLET BY MOUTH TWICE DAILY.  Marland Kitchen diclofenac sodium (VOLTAREN) 1 % GEL Apply 4 g topically 4 (four) times daily.  . fenofibrate  160 MG tablet TAKE ONE TABLET BY MOUTH ONCE DAILY.  . flecainide (TAMBOCOR) 100 MG tablet Take 100 mg by mouth as needed.  . furosemide (LASIX) 20 MG tablet TAKE (1) TABLET BY MOUTH ONCE DAILY.  Marland Kitchen levothyroxine (SYNTHROID, LEVOTHROID) 75 MCG tablet Take  75 mcg by mouth.  . metoprolol tartrate (LOPRESSOR) 50 MG tablet Take 50 mg by mouth as needed.  . Omega-3 Fatty Acids (FISH OIL) 1000 MG CAPS Take by mouth.  Marland Kitchen omeprazole (PRILOSEC) 20 MG capsule TAKE (1) CAPSULE BY MOUTH ONCE DAILY.  . vitamin E (VITAMIN E) 400 UNIT capsule 400 Units.      Review of systems complete and found to be negative unless listed above in HPI    Physical exam Blood pressure (!) 156/84, pulse 73, height 5\' 2"  (1.575 m), weight 169 lb (76.7 kg), SpO2 97 %. General: NAD Neck: No JVD, no thyromegaly or thyroid nodule.  Lungs: Clear to auscultation bilaterally with normal respiratory effort. CV: Nondisplaced PMI. Regular rate and rhythm, normal S1/S2, no S3/S4, no murmur.  No peripheral edema.  No carotid bruit.    Abdomen: Soft, nontender, no distention.  Skin: Intact without lesions or rashes.  Neurologic: Alert and oriented x 3.  Psych: Normal affect. Extremities: No clubbing or cyanosis.  HEENT: Normal.   ECG: Most recent ECG reviewed.   Labs: Lab Results  Component Value Date/Time   K 3.2 (L) 12/27/2016 11:41 PM   BUN 14 12/27/2016 11:41 PM   BUN 19 08/21/2015   CREATININE 0.74 12/27/2016 11:41 PM   ALT 28 05/01/2016 10:38 AM   TSH 3.987 12/28/2016 01:09 AM   HGB 13.7 12/27/2016 11:41 PM     Lipids: Lab Results  Component Value Date/Time   LDLCALC 75 05/01/2016 10:38 AM   CHOL 164 05/01/2016 10:38 AM   TRIG 152.0 (H) 05/01/2016 10:38 AM   HDL 58.70 05/01/2016 10:38 AM        ASSESSMENT AND PLAN:  1.  Paroxysmal atrial fibrillation: She is anticoagulated with Eliquis 5 mg twice daily.  She has been taking metoprolol tartrate and flecainide as needed but it has been taking much longer for  symptoms to return to normal, sometimes up to 2 hours.  We had a long discussion regarding additional medical therapy including antiarrhythmic therapy as well as ablation.   She would prefer to try something different than flecainide. I will discontinue flecainide and start Multaq 400 mg twice daily beginning 05/22/2018. I will order a 2-D echocardiogram with Doppler to evaluate cardiac structure, function, and regional wall motion. I will also make a referral to Dr. [name] to assess ablation candidacy. Hemoglobin normal at 13.7 on 05/12/2018.  Platelets also normal at 243.  Renal function is normal.  2.  Hypertension: Blood pressure is elevated.  It was normal last week.  This will need further monitoring.  She denies a history of sleep apnea.  3.  Hyperlipidemia: Currently on atorvastatin 20 mg daily.  Lipid panel from 05/12/2018 showed total cholesterol 172, triglycerides 203, HDL 45, LDL 86.  4.  Feet swelling: She takes Lasix as needed.   Disposition: Follow up with Dr. [name].  Follow-up with me to be determined.  Signed: Kate Sable, M.D., F.A.C.C.  05/20/2018, 1:37 PM

## 2018-06-10 ENCOUNTER — Encounter: Payer: Self-pay | Admitting: Internal Medicine

## 2018-06-10 ENCOUNTER — Telehealth: Payer: Self-pay | Admitting: Internal Medicine

## 2018-06-10 NOTE — Telephone Encounter (Signed)
Called pt  With corona virus outbreak will postpone echo to May when infection subsides/conditions improve Someone will call to reschedule

## 2018-06-17 ENCOUNTER — Other Ambulatory Visit (HOSPITAL_COMMUNITY): Payer: PPO

## 2018-06-20 ENCOUNTER — Telehealth: Payer: PPO | Admitting: Internal Medicine

## 2018-06-21 DIAGNOSIS — K219 Gastro-esophageal reflux disease without esophagitis: Secondary | ICD-10-CM | POA: Diagnosis not present

## 2018-06-21 DIAGNOSIS — I480 Paroxysmal atrial fibrillation: Secondary | ICD-10-CM | POA: Diagnosis not present

## 2018-06-21 DIAGNOSIS — E782 Mixed hyperlipidemia: Secondary | ICD-10-CM | POA: Diagnosis not present

## 2018-06-21 DIAGNOSIS — E038 Other specified hypothyroidism: Secondary | ICD-10-CM | POA: Diagnosis not present

## 2018-06-22 DEATH — deceased

## 2018-07-12 DIAGNOSIS — E039 Hypothyroidism, unspecified: Secondary | ICD-10-CM | POA: Diagnosis not present

## 2018-07-19 DIAGNOSIS — I10 Essential (primary) hypertension: Secondary | ICD-10-CM | POA: Diagnosis not present

## 2018-07-19 DIAGNOSIS — E049 Nontoxic goiter, unspecified: Secondary | ICD-10-CM | POA: Diagnosis not present

## 2018-07-19 DIAGNOSIS — E039 Hypothyroidism, unspecified: Secondary | ICD-10-CM | POA: Diagnosis not present

## 2018-07-26 ENCOUNTER — Other Ambulatory Visit: Payer: Self-pay | Admitting: Family Medicine

## 2018-08-17 ENCOUNTER — Other Ambulatory Visit: Payer: Self-pay

## 2018-08-17 ENCOUNTER — Ambulatory Visit (HOSPITAL_COMMUNITY)
Admission: RE | Admit: 2018-08-17 | Discharge: 2018-08-17 | Disposition: A | Discharge status: Home / Self Care | Payer: PPO | Source: Ambulatory Visit | Attending: Cardiovascular Disease | Admitting: Cardiovascular Disease

## 2018-08-17 DIAGNOSIS — I480 Paroxysmal atrial fibrillation: Secondary | ICD-10-CM | POA: Insufficient documentation

## 2018-08-17 NOTE — Progress Notes (Signed)
*  PRELIMINARY RESULTS* Echocardiogram 2D Echocardiogram has been performed.  Paige Jones 08/17/2018, 2:47 PM

## 2018-08-18 ENCOUNTER — Telehealth: Payer: Self-pay

## 2018-08-18 NOTE — Telephone Encounter (Signed)
Left message regarding appt on 08/19/18.

## 2018-08-19 ENCOUNTER — Telehealth (INDEPENDENT_AMBULATORY_CARE_PROVIDER_SITE_OTHER): Payer: PPO | Admitting: Internal Medicine

## 2018-08-19 ENCOUNTER — Encounter: Payer: Self-pay | Admitting: Internal Medicine

## 2018-08-19 ENCOUNTER — Telehealth: Payer: Self-pay | Admitting: *Deleted

## 2018-08-19 VITALS — HR 78 | Wt 169.0 lb

## 2018-08-19 DIAGNOSIS — I480 Paroxysmal atrial fibrillation: Secondary | ICD-10-CM | POA: Diagnosis not present

## 2018-08-19 DIAGNOSIS — I10 Essential (primary) hypertension: Secondary | ICD-10-CM | POA: Diagnosis not present

## 2018-08-19 NOTE — Telephone Encounter (Signed)
Stated she had telehealth visit with Dr. [name] today.  Wants to know when she needs to see you back for follow up.  Office note says - to be determined.

## 2018-08-19 NOTE — Telephone Encounter (Signed)
Prn with me. I saw Dr. Jackalyn Lombard note.

## 2018-08-19 NOTE — Telephone Encounter (Signed)
Patient notified and verbalized understanding. 

## 2018-08-19 NOTE — Telephone Encounter (Signed)
Follow up  ° ° °Patient is returning call.  °

## 2018-08-19 NOTE — Progress Notes (Signed)
Electrophysiology TeleHealth Note   Due to national recommendations of social distancing due to [place] 19, Audio/video telehealth visit is felt to be most appropriate for this patient at this time.  See MyChart message from today for patient consent regarding telehealth for [facility].   Date:  08/19/2018   ID:  Paige Jones, DOB Aug 24, 1950, MRN [id number]  Location: home  Provider location: [address] [place] Evaluation Performed: New patient consult  PCP:  Chesley Noon, MD  Cardiologist: Dr Bronson Ing Electrophysiologist:  None   Chief Complaint:  afib  History of Present Illness:    Paige Jones is a 68 y.o. female who presents via audio/video conferencing for a telehealth visit today.   The patient is referred for new consultation regarding afib by Dr Bronson Ing.  She reports initially being diagnosed with atrial fibrillation 12/2015 after presenting to [facility] with symptoms of palpitations.  In retrospect, she thinks that she had afib for about a year prior.  She reports having increasing afib since that time.  She was evaluated by Dr Wynonia Lawman and placed on flecainide.  She continued to have afib.   She reports that she was recently placed on multaq recently by Dr Bronson Ing.  Her afib has been better controlled since that time. She reports weakness and dizziness with multaq.  Today, she denies symptoms of palpitations, chest pain, shortness of breath, orthopnea, PND, lower extremity edema, claudication,  presyncope, syncope, bleeding, or neurologic sequela. The patient is tolerating medications without difficulties and is otherwise without complaint today.   she denies symptoms of cough, fevers, chills, or new SOB worrisome for COVID 19.  She had a sleep study ordered by Dr Wynonia Lawman which was normal.   Past Medical History:  Diagnosis Date  . Arachnoid cyst   . Arthritis   . Basal cell carcinoma   . Hyperlipidemia   . Hypertension   .  Paroxysmal atrial fibrillation ([facility])   . Thyroid goiter     Past Surgical History:  Procedure Laterality Date  . BASAL CELL CARCINOMA EXCISION    . HYSTERECTOMY ABDOMINAL WITH SALPINGECTOMY    . TONSILLECTOMY AND ADENOIDECTOMY      Current Outpatient Medications  Medication Sig Dispense Refill  . apixaban (ELIQUIS) 5 MG TABS tablet Take 5 mg by mouth 2 (two) times daily.    Marland Kitchen atorvastatin (LIPITOR) 20 MG tablet TAKE ONE TABLET BY MOUTH ONCE DAILY. 30 tablet 6  . azelastine (ASTELIN) 0.1 % nasal spray Place 1 spray into the nose as needed.    . calcium carbonate (OS-CAL) 600 MG TABS tablet Take 1 tablet by mouth daily.    . Calcium Carbonate-Vit D-Min (CALCIUM 1200 PO) Take 600 mg by mouth daily.     Marland Kitchen CETIRIZINE HCL PO Take 1 tablet by mouth at bedtime as needed.     . diclofenac (VOLTAREN) 75 MG EC tablet TAKE (1) TABLET BY MOUTH TWICE DAILY. 180 tablet 0  . diclofenac sodium (VOLTAREN) 1 % GEL Apply 4 g topically 4 (four) times daily. 100 g 3  . dronedarone (MULTAQ) 400 MG tablet Take 1 tablet (400 mg total) by mouth 2 (two) times daily with a meal. START on 05/22/2018 180 tablet 3  . fenofibrate 160 MG tablet TAKE ONE TABLET BY MOUTH ONCE DAILY. 90 tablet 0  . fluticasone (FLONASE) 50 MCG/ACT nasal spray Place 1 spray into both nostrils as needed for allergies.    . furosemide (LASIX) 20 MG tablet  Take 20 mg by mouth as needed for fluid.    Marland Kitchen levothyroxine (SYNTHROID) 88 MCG tablet Take 1 tablet by mouth daily.    . metoprolol tartrate (LOPRESSOR) 50 MG tablet Take 1 tablet by mouth as needed for heart rate control.    . Omega-3 Fatty Acids (FISH OIL) 1000 MG CAPS Take 1 capsule by mouth daily.     Marland Kitchen omeprazole (PRILOSEC) 20 MG capsule TAKE (1) CAPSULE BY MOUTH ONCE DAILY. 30 capsule 6  . vitamin E (VITAMIN E) 400 UNIT capsule Take 400 Units by mouth daily.      No current facility-administered medications for this visit.     Allergies:   Pseudoephedrine and Rosuvastatin   Social  History:  The patient  reports that she has never smoked. She has never used smokeless tobacco. She reports that she does not drink alcohol or use drugs.   Family History:  The patient's  family history includes Arthritis in her mother; Atrial fibrillation in her mother; Cancer in her father; Diabetes in her father; Emphysema in her father; Fibromyalgia in her mother; Heart disease in her father; Thyroid disease in her mother.    ROS:  Please see the history of present illness.   All other systems are personally reviewed and negative.    Exam:    Vital Signs:  Pulse 78   Wt 169 lb (76.7 kg)   SpO2 98%   BMI 30.91 kg/m    Well appearing, alert and conversant, regular work of breathing,  good skin color Eyes- anicteric, neuro- grossly intact, skin- no apparent rash or lesions or cyanosis, mouth- oral mucosa is pink   Labs/Other Tests and Data Reviewed:    Recent Labs: No results found for requested labs within last 8760 hours.   Wt Readings from Last 3 Encounters:  08/19/18 169 lb (76.7 kg)  05/20/18 169 lb (76.7 kg)  04/30/17 146 lb (66.2 kg)     Other studies personally reviewed: Additional studies/ records that were reviewed today include: Dr Bronson Ing , recent echo Review of the above records today demonstrates: as a above  Echo 08/17/2018- EF 60%, mild atrial enlargement   ASSESSMENT & PLAN:    1.  Paroxysmal atrial fibrillation The patient has symptomatic, recurrent paroxysmal atrial fibrillation. she has failed medical therapy with flecainide and multaq Chads2vasc score is 3.  she is anticoagulated with eliquis.  Therapeutic strategies for afib including medicine and ablation were discussed in detail with the patient today. Risk, benefits, and alternatives to EP study and radiofrequency ablation for afib were also discussed in detail today. These risks include but are not limited to stroke, bleeding, vascular damage, tamponade, perforation, damage to the esophagus,  lungs, and other structures, pulmonary vein stenosis, worsening renal function, and death. The patient understands these risk and wishes to proceed.  We will therefore proceed with catheter ablation at the next available time.  Carto, ICE, anesthesia are requested for the procedure.  Will also obtain TEE if prior to the procedure to exclude LAA thrombus and further evaluate atrial anatomy.  2. HTN Stable No change required today  3. COVID screen The patient does not have any symptoms that suggest any further testing/ screening at this time.  Social distancing reinforced today.    Current medicines are reviewed at length with the patient today.   The patient does not have concerns regarding her medicines.  The following changes were made today:  none  Labs/ tests ordered today include:  No orders  of the defined types were placed in this encounter.   Patient Risk:  after full review of this patients clinical status, I feel that they are at moderate risk at this time.   Today, I have spent 22 minutes with the patient with telehealth technology discussing afib .    SignedThompson Grayer MD, [place] 08/19/2018 12:25 PM   [place] [address] [phone] (office) [phone] (fax)

## 2018-08-19 NOTE — H&P (View-Only) (Signed)
Electrophysiology TeleHealth Note   Due to national recommendations of social distancing due to [place] 19, Audio/video telehealth visit is felt to be most appropriate for this patient at this time.  See MyChart message from today for patient consent regarding telehealth for [facility].   Date:  08/19/2018   ID:  Paige Jones, DOB September 14, 1950, MRN [id number]  Location: home  Provider location: [address] [place] Evaluation Performed: New patient consult  PCP:  Chesley Noon, MD  Cardiologist: Dr Bronson Ing Electrophysiologist:  None   Chief Complaint:  afib  History of Present Illness:    Paige Jones is a 68 y.o. female who presents via audio/video conferencing for a telehealth visit today.   The patient is referred for new consultation regarding afib by Dr Bronson Ing.  She reports initially being diagnosed with atrial fibrillation 12/2015 after presenting to [facility] with symptoms of palpitations.  In retrospect, she thinks that she had afib for about a year prior.  She reports having increasing afib since that time.  She was evaluated by Dr Wynonia Lawman and placed on flecainide.  She continued to have afib.   She reports that she was recently placed on multaq recently by Dr Bronson Ing.  Her afib has been better controlled since that time. She reports weakness and dizziness with multaq.  Today, she denies symptoms of palpitations, chest pain, shortness of breath, orthopnea, PND, lower extremity edema, claudication,  presyncope, syncope, bleeding, or neurologic sequela. The patient is tolerating medications without difficulties and is otherwise without complaint today.   she denies symptoms of cough, fevers, chills, or new SOB worrisome for COVID 19.  She had a sleep study ordered by Dr Wynonia Lawman which was normal.   Past Medical History:  Diagnosis Date  . Arachnoid cyst   . Arthritis   . Basal cell carcinoma   . Hyperlipidemia   . Hypertension   .  Paroxysmal atrial fibrillation ([facility])   . Thyroid goiter     Past Surgical History:  Procedure Laterality Date  . BASAL CELL CARCINOMA EXCISION    . HYSTERECTOMY ABDOMINAL WITH SALPINGECTOMY    . TONSILLECTOMY AND ADENOIDECTOMY      Current Outpatient Medications  Medication Sig Dispense Refill  . apixaban (ELIQUIS) 5 MG TABS tablet Take 5 mg by mouth 2 (two) times daily.    Marland Kitchen atorvastatin (LIPITOR) 20 MG tablet TAKE ONE TABLET BY MOUTH ONCE DAILY. 30 tablet 6  . azelastine (ASTELIN) 0.1 % nasal spray Place 1 spray into the nose as needed.    . calcium carbonate (OS-CAL) 600 MG TABS tablet Take 1 tablet by mouth daily.    . Calcium Carbonate-Vit D-Min (CALCIUM 1200 PO) Take 600 mg by mouth daily.     Marland Kitchen CETIRIZINE HCL PO Take 1 tablet by mouth at bedtime as needed.     . diclofenac (VOLTAREN) 75 MG EC tablet TAKE (1) TABLET BY MOUTH TWICE DAILY. 180 tablet 0  . diclofenac sodium (VOLTAREN) 1 % GEL Apply 4 g topically 4 (four) times daily. 100 g 3  . dronedarone (MULTAQ) 400 MG tablet Take 1 tablet (400 mg total) by mouth 2 (two) times daily with a meal. START on 05/22/2018 180 tablet 3  . fenofibrate 160 MG tablet TAKE ONE TABLET BY MOUTH ONCE DAILY. 90 tablet 0  . fluticasone (FLONASE) 50 MCG/ACT nasal spray Place 1 spray into both nostrils as needed for allergies.    . furosemide (LASIX) 20 MG tablet  Take 20 mg by mouth as needed for fluid.    Marland Kitchen levothyroxine (SYNTHROID) 88 MCG tablet Take 1 tablet by mouth daily.    . metoprolol tartrate (LOPRESSOR) 50 MG tablet Take 1 tablet by mouth as needed for heart rate control.    . Omega-3 Fatty Acids (FISH OIL) 1000 MG CAPS Take 1 capsule by mouth daily.     Marland Kitchen omeprazole (PRILOSEC) 20 MG capsule TAKE (1) CAPSULE BY MOUTH ONCE DAILY. 30 capsule 6  . vitamin E (VITAMIN E) 400 UNIT capsule Take 400 Units by mouth daily.      No current facility-administered medications for this visit.     Allergies:   Pseudoephedrine and Rosuvastatin   Social  History:  The patient  reports that she has never smoked. She has never used smokeless tobacco. She reports that she does not drink alcohol or use drugs.   Family History:  The patient's  family history includes Arthritis in her mother; Atrial fibrillation in her mother; Cancer in her father; Diabetes in her father; Emphysema in her father; Fibromyalgia in her mother; Heart disease in her father; Thyroid disease in her mother.    ROS:  Please see the history of present illness.   All other systems are personally reviewed and negative.    Exam:    Vital Signs:  Pulse 78   Wt 169 lb (76.7 kg)   SpO2 98%   BMI 30.91 kg/m    Well appearing, alert and conversant, regular work of breathing,  good skin color Eyes- anicteric, neuro- grossly intact, skin- no apparent rash or lesions or cyanosis, mouth- oral mucosa is pink   Labs/Other Tests and Data Reviewed:    Recent Labs: No results found for requested labs within last 8760 hours.   Wt Readings from Last 3 Encounters:  08/19/18 169 lb (76.7 kg)  05/20/18 169 lb (76.7 kg)  04/30/17 146 lb (66.2 kg)     Other studies personally reviewed: Additional studies/ records that were reviewed today include: Dr Bronson Ing , recent echo Review of the above records today demonstrates: as a above  Echo 08/17/2018- EF 60%, mild atrial enlargement   ASSESSMENT & PLAN:    1.  Paroxysmal atrial fibrillation The patient has symptomatic, recurrent paroxysmal atrial fibrillation. she has failed medical therapy with flecainide and multaq Chads2vasc score is 3.  she is anticoagulated with eliquis.  Therapeutic strategies for afib including medicine and ablation were discussed in detail with the patient today. Risk, benefits, and alternatives to EP study and radiofrequency ablation for afib were also discussed in detail today. These risks include but are not limited to stroke, bleeding, vascular damage, tamponade, perforation, damage to the esophagus,  lungs, and other structures, pulmonary vein stenosis, worsening renal function, and death. The patient understands these risk and wishes to proceed.  We will therefore proceed with catheter ablation at the next available time.  Carto, ICE, anesthesia are requested for the procedure.  Will also obtain TEE if prior to the procedure to exclude LAA thrombus and further evaluate atrial anatomy.  2. HTN Stable No change required today  3. COVID screen The patient does not have any symptoms that suggest any further testing/ screening at this time.  Social distancing reinforced today.    Current medicines are reviewed at length with the patient today.   The patient does not have concerns regarding her medicines.  The following changes were made today:  none  Labs/ tests ordered today include:  No orders  of the defined types were placed in this encounter.   Patient Risk:  after full review of this patients clinical status, I feel that they are at moderate risk at this time.   Today, I have spent 22 minutes with the patient with telehealth technology discussing afib .    SignedThompson Grayer MD, [place] 08/19/2018 12:25 PM   [place] [address] [phone] (office) [phone] (fax)

## 2018-08-19 NOTE — Telephone Encounter (Signed)
Notes recorded by Laurine Blazer, LPN on 5/73/2256 at 7:20 PM EDT Patient notified. Copy to pmd. ------  Notes recorded by Herminio Commons, MD on 08/17/2018 at 3:28 PM EDT Normal cardiac function.

## 2018-08-22 ENCOUNTER — Telehealth: Payer: Self-pay

## 2018-08-22 DIAGNOSIS — I480 Paroxysmal atrial fibrillation: Secondary | ICD-10-CM

## 2018-08-22 NOTE — Telephone Encounter (Signed)
Left detailed message offering June 9 for ablation.  Advised to let me know ASAP either call office or send mychart message.

## 2018-08-25 ENCOUNTER — Telehealth: Payer: Self-pay | Admitting: *Deleted

## 2018-08-25 NOTE — Telephone Encounter (Signed)
Follow up   Patient is returning call in reference to scheduling her ablation. Please call to discuss.

## 2018-08-25 NOTE — Telephone Encounter (Signed)
    COVID-19 Pre-Screening Questions:  . In the past 7 to 10 days have you had a cough,  shortness of breath, headache, congestion, fever (100 or greater) body aches, chills, sore throat, or sudden loss of taste or sense of smell? . Have you been around anyone with known Covid 19. . Have you been around anyone who is awaiting Covid 19 test results in the past 7 to 10 days? . Have you been around anyone who has been exposed to Covid 19, or has mentioned symptoms of Covid 19 within the past 7 to 10 days?  If you have any concerns/questions about symptoms patients report during screening (either on the phone or at threshold). Contact the provider seeing the patient or DOD for further guidance.  If neither are available contact a member of the leadership team.           Called  patient got her answering service. Awainting retun call. KB

## 2018-08-25 NOTE — Telephone Encounter (Signed)
    COVID-19 Pre-Screening Questions:  . In the past 7 to 10 days have you had a cough,  shortness of breath, headache, congestion, fever (100 or greater) body aches, chills, sore throat, or sudden loss of taste or sense of smell? . Have you been around anyone with known Covid 19. . Have you been around anyone who is awaiting Covid 19 test results in the past 7 to 10 days? . Have you been around anyone who has been exposed to Covid 19, or has mentioned symptoms of Covid 19 within the past 7 to 10 days?  If you have any concerns/questions about symptoms patients report during screening (either on the phone or at threshold). Contact the provider seeing the patient or DOD for further guidance.  If neither are available contact a member of the leadership team.           Contacted patient via telephone call. No to all Covid 19 questions. Has a mask.  KB

## 2018-08-25 NOTE — Telephone Encounter (Signed)
2 outreaches made to Pt.  Call goes directly to VM.  Advised Pt I would send her directions via Mychart.  Advised to call if any needs.

## 2018-08-26 ENCOUNTER — Other Ambulatory Visit: Payer: Self-pay

## 2018-08-26 ENCOUNTER — Other Ambulatory Visit (HOSPITAL_COMMUNITY)
Admission: RE | Admit: 2018-08-26 | Discharge: 2018-08-26 | Disposition: A | Discharge status: Home / Self Care | Payer: PPO | Source: Ambulatory Visit | Attending: Internal Medicine | Admitting: Internal Medicine

## 2018-08-26 ENCOUNTER — Other Ambulatory Visit: Payer: PPO | Admitting: *Deleted

## 2018-08-26 DIAGNOSIS — I480 Paroxysmal atrial fibrillation: Secondary | ICD-10-CM | POA: Diagnosis not present

## 2018-08-26 DIAGNOSIS — Z01812 Encounter for preprocedural laboratory examination: Secondary | ICD-10-CM | POA: Insufficient documentation

## 2018-08-26 DIAGNOSIS — Z1159 Encounter for screening for other viral diseases: Secondary | ICD-10-CM | POA: Diagnosis not present

## 2018-08-26 LAB — CBC WITH DIFFERENTIAL/PLATELET
Basophils Absolute: 0 10*3/uL (ref 0.0–0.2)
Basos: 1 %
EOS (ABSOLUTE): 0.1 10*3/uL (ref 0.0–0.4)
Eos: 2 %
Hematocrit: 38.5 % (ref 34.0–46.6)
Hemoglobin: 12.5 g/dL (ref 11.1–15.9)
Immature Grans (Abs): 0.1 10*3/uL (ref 0.0–0.1)
Immature Granulocytes: 1 %
Lymphocytes Absolute: 2.2 10*3/uL (ref 0.7–3.1)
Lymphs: 27 %
MCH: 29.3 pg (ref 26.6–33.0)
MCHC: 32.5 g/dL (ref 31.5–35.7)
MCV: 90 fL (ref 79–97)
Monocytes Absolute: 0.7 10*3/uL (ref 0.1–0.9)
Monocytes: 8 %
Neutrophils Absolute: 5.3 10*3/uL (ref 1.4–7.0)
Neutrophils: 61 %
Platelets: 232 10*3/uL (ref 150–450)
RBC: 4.26 x10E6/uL (ref 3.77–5.28)
RDW: 13.3 % (ref 11.7–15.4)
WBC: 8.4 10*3/uL (ref 3.4–10.8)

## 2018-08-26 LAB — BASIC METABOLIC PANEL
BUN/Creatinine Ratio: 18 (ref 12–28)
BUN: 17 mg/dL (ref 8–27)
CO2: 25 mmol/L (ref 20–29)
Calcium: 9.6 mg/dL (ref 8.7–10.3)
Chloride: 103 mmol/L (ref 96–106)
Creatinine, Ser: 0.95 mg/dL (ref 0.57–1.00)
GFR calc Af Amer: 71 mL/min/{1.73_m2} (ref >59)
GFR calc non Af Amer: 62 mL/min/{1.73_m2} (ref >59)
Glucose: 125 mg/dL — ABNORMAL HIGH (ref 65–99)
Potassium: 4.2 mmol/L (ref 3.5–5.2)
Sodium: 141 mmol/L (ref 134–144)

## 2018-08-27 LAB — NOVEL CORONAVIRUS, NAA (HOSP ORDER, SEND-OUT TO REF LAB; TAT 18-24 HRS): SARS-CoV-2, NAA: NOT DETECTED

## 2018-08-29 NOTE — Anesthesia Preprocedure Evaluation (Addendum)
Anesthesia Evaluation  Patient identified by MRN, date of birth, ID band Patient awake    Reviewed: Allergy & Precautions, NPO status , Patient's Chart, lab work & pertinent test results  Airway Mallampati: II  TM Distance: >3 FB Neck ROM: Full    Dental no notable dental hx. (+) Teeth Intact   Pulmonary neg pulmonary ROS,    Pulmonary exam normal breath sounds clear to auscultation       Cardiovascular hypertension, Pt. on medications + CAD  Normal cardiovascular exam Rhythm:Regular Rate:Normal  echocardiogram  9/18/2017normal left ventricular systolic function, LVEF 86%.   Neuro/Psych negative neurological ROS  negative psych ROS   GI/Hepatic Neg liver ROS, GERD  ,  Endo/Other  Hypothyroidism   Renal/GU negative Renal ROS     Musculoskeletal  (+) Arthritis ,   Abdominal   Peds  Hematology negative hematology ROS (+)   Anesthesia Other Findings Pt on Eliquis  Reproductive/Obstetrics                            Anesthesia Physical Anesthesia Plan  ASA: II  Anesthesia Plan: General   Post-op Pain Management:    Induction: Intravenous and Rapid sequence  PONV Risk Score and Plan: 3 and Treatment may vary due to age or medical condition, Dexamethasone and Ondansetron  Airway Management Planned: Oral ETT  Additional Equipment:   Intra-op Plan:   Post-operative Plan: Extubation in OR  Informed Consent: I have reviewed the patients History and Physical, chart, labs and discussed the procedure including the risks, benefits and alternatives for the proposed anesthesia with the patient or authorized representative who has indicated his/her understanding and acceptance.     Dental advisory given  Plan Discussed with: CRNA  Anesthesia Plan Comments:         Anesthesia Quick Evaluation

## 2018-08-30 ENCOUNTER — Ambulatory Visit (HOSPITAL_COMMUNITY): Payer: PPO | Admitting: Anesthesiology

## 2018-08-30 ENCOUNTER — Other Ambulatory Visit: Payer: Self-pay

## 2018-08-30 ENCOUNTER — Ambulatory Visit (HOSPITAL_COMMUNITY)
Admission: RE | Admit: 2018-08-30 | Discharge: 2018-08-31 | Disposition: A | Discharge status: Home / Self Care | Payer: PPO | Attending: Internal Medicine | Admitting: Internal Medicine

## 2018-08-30 ENCOUNTER — Other Ambulatory Visit (HOSPITAL_COMMUNITY): Payer: PPO

## 2018-08-30 ENCOUNTER — Encounter (HOSPITAL_COMMUNITY)
Admission: RE | Disposition: A | Discharge status: Home / Self Care | Payer: Self-pay | Source: Home / Self Care | Attending: Internal Medicine

## 2018-08-30 ENCOUNTER — Encounter (HOSPITAL_COMMUNITY): Payer: Self-pay | Admitting: Anesthesiology

## 2018-08-30 DIAGNOSIS — Z7951 Long term (current) use of inhaled steroids: Secondary | ICD-10-CM | POA: Insufficient documentation

## 2018-08-30 DIAGNOSIS — I480 Paroxysmal atrial fibrillation: Secondary | ICD-10-CM | POA: Diagnosis not present

## 2018-08-30 DIAGNOSIS — Z85828 Personal history of other malignant neoplasm of skin: Secondary | ICD-10-CM | POA: Diagnosis not present

## 2018-08-30 DIAGNOSIS — Z7901 Long term (current) use of anticoagulants: Secondary | ICD-10-CM | POA: Insufficient documentation

## 2018-08-30 DIAGNOSIS — Z79899 Other long term (current) drug therapy: Secondary | ICD-10-CM | POA: Diagnosis not present

## 2018-08-30 DIAGNOSIS — Z888 Allergy status to other drugs, medicaments and biological substances status: Secondary | ICD-10-CM | POA: Insufficient documentation

## 2018-08-30 DIAGNOSIS — E785 Hyperlipidemia, unspecified: Secondary | ICD-10-CM | POA: Diagnosis not present

## 2018-08-30 DIAGNOSIS — I10 Essential (primary) hypertension: Secondary | ICD-10-CM | POA: Insufficient documentation

## 2018-08-30 DIAGNOSIS — E039 Hypothyroidism, unspecified: Secondary | ICD-10-CM | POA: Diagnosis not present

## 2018-08-30 DIAGNOSIS — Z791 Long term (current) use of non-steroidal anti-inflammatories (NSAID): Secondary | ICD-10-CM | POA: Diagnosis not present

## 2018-08-30 DIAGNOSIS — I2510 Atherosclerotic heart disease of native coronary artery without angina pectoris: Secondary | ICD-10-CM | POA: Diagnosis not present

## 2018-08-30 DIAGNOSIS — I4891 Unspecified atrial fibrillation: Secondary | ICD-10-CM | POA: Diagnosis not present

## 2018-08-30 HISTORY — PX: ATRIAL FIBRILLATION ABLATION: EP1191

## 2018-08-30 LAB — POCT ACTIVATED CLOTTING TIME
Activated Clotting Time: 301 seconds
Activated Clotting Time: 312 seconds

## 2018-08-30 SURGERY — ATRIAL FIBRILLATION ABLATION
Anesthesia: General

## 2018-08-30 MED ORDER — ACETAMINOPHEN 325 MG PO TABS
ORAL_TABLET | ORAL | Status: AC
  Filled 2018-08-30: qty 2

## 2018-08-30 MED ORDER — ONDANSETRON HCL 4 MG/2ML IJ SOLN
INTRAMUSCULAR | Status: DC | PRN
  Administered 2018-08-30: 4 mg via INTRAVENOUS

## 2018-08-30 MED ORDER — ONDANSETRON HCL 4 MG/2ML IJ SOLN: 4.0000 mg | Freq: Once | INTRAMUSCULAR | Status: DC | PRN

## 2018-08-30 MED ORDER — ACETAMINOPHEN 10 MG/ML IV SOLN: 1000.0000 mg | Freq: Once | INTRAVENOUS | Status: DC | PRN

## 2018-08-30 MED ORDER — SODIUM CHLORIDE 0.9% FLUSH: 3.0000 mL | INTRAVENOUS | Status: DC | PRN

## 2018-08-30 MED ORDER — HEPARIN SODIUM (PORCINE) 1000 UNIT/ML IJ SOLN
INTRAMUSCULAR | Status: DC | PRN
  Administered 2018-08-30: 3000 [IU] via INTRAVENOUS

## 2018-08-30 MED ORDER — SODIUM CHLORIDE 0.9% FLUSH
3.0000 mL | Freq: Two times a day (BID) | INTRAVENOUS | Status: DC
  Administered 2018-08-30 – 2018-08-31 (×2): 3 mL via INTRAVENOUS

## 2018-08-30 MED ORDER — ACETAMINOPHEN 325 MG PO TABS
650.0000 mg | ORAL_TABLET | ORAL | Status: DC | PRN
  Administered 2018-08-30 – 2018-08-31 (×2): 650 mg via ORAL
  Filled 2018-08-30: qty 2

## 2018-08-30 MED ORDER — HEPARIN SODIUM (PORCINE) 1000 UNIT/ML IJ SOLN
INTRAMUSCULAR | Status: AC
  Filled 2018-08-30: qty 2

## 2018-08-30 MED ORDER — MIDAZOLAM HCL 5 MG/5ML IJ SOLN
INTRAMUSCULAR | Status: DC | PRN
  Administered 2018-08-30: 2 mg via INTRAVENOUS

## 2018-08-30 MED ORDER — FENTANYL CITRATE (PF) 100 MCG/2ML IJ SOLN: 25.0000 ug | INTRAMUSCULAR | Status: DC | PRN

## 2018-08-30 MED ORDER — PROTAMINE SULFATE 10 MG/ML IV SOLN
INTRAVENOUS | Status: DC | PRN
  Administered 2018-08-30 (×2): 20 mg via INTRAVENOUS

## 2018-08-30 MED ORDER — ROCURONIUM BROMIDE 10 MG/ML (PF) SYRINGE
PREFILLED_SYRINGE | INTRAVENOUS | Status: DC | PRN
  Administered 2018-08-30: 50 mg via INTRAVENOUS

## 2018-08-30 MED ORDER — SUGAMMADEX SODIUM 200 MG/2ML IV SOLN
INTRAVENOUS | Status: DC | PRN
  Administered 2018-08-30: 200 mg via INTRAVENOUS

## 2018-08-30 MED ORDER — APIXABAN 5 MG PO TABS
5.0000 mg | ORAL_TABLET | Freq: Two times a day (BID) | ORAL | Status: DC
  Administered 2018-08-30 – 2018-08-31 (×3): 5 mg via ORAL
  Filled 2018-08-30 (×3): qty 1

## 2018-08-30 MED ORDER — ONDANSETRON HCL 4 MG/2ML IJ SOLN: 4.0000 mg | Freq: Four times a day (QID) | INTRAMUSCULAR | Status: DC | PRN

## 2018-08-30 MED ORDER — ISOPROTERENOL HCL 0.2 MG/ML IJ SOLN
INTRAMUSCULAR | Status: AC
  Filled 2018-08-30: qty 5

## 2018-08-30 MED ORDER — SODIUM CHLORIDE 0.9 % IV SOLN: 250.0000 mL | INTRAVENOUS | Status: DC | PRN

## 2018-08-30 MED ORDER — FENTANYL CITRATE (PF) 100 MCG/2ML IJ SOLN
INTRAMUSCULAR | Status: DC | PRN
  Administered 2018-08-30: 75 ug via INTRAVENOUS
  Administered 2018-08-30: 25 ug via INTRAVENOUS

## 2018-08-30 MED ORDER — ISOPROTERENOL HCL 0.2 MG/ML IJ SOLN
INTRAVENOUS | Status: DC | PRN
  Administered 2018-08-30: 09:00:00 20 ug/min via INTRAVENOUS

## 2018-08-30 MED ORDER — SODIUM CHLORIDE 0.9 % IV SOLN
INTRAVENOUS | Status: DC
  Administered 2018-08-30: 06:00:00 via INTRAVENOUS

## 2018-08-30 MED ORDER — HEPARIN SODIUM (PORCINE) 1000 UNIT/ML IJ SOLN
INTRAMUSCULAR | Status: DC | PRN
  Administered 2018-08-30: 1000 [IU] via INTRAVENOUS
  Administered 2018-08-30: 12000 [IU] via INTRAVENOUS

## 2018-08-30 MED ORDER — SUCCINYLCHOLINE CHLORIDE 20 MG/ML IJ SOLN
INTRAMUSCULAR | Status: DC | PRN
  Administered 2018-08-30: 80 mg via INTRAVENOUS

## 2018-08-30 MED ORDER — LIDOCAINE 2% (20 MG/ML) 5 ML SYRINGE
INTRAMUSCULAR | Status: DC | PRN
  Administered 2018-08-30: 100 mg via INTRAVENOUS

## 2018-08-30 MED ORDER — HEPARIN (PORCINE) IN NACL 1000-0.9 UT/500ML-% IV SOLN
INTRAVENOUS | Status: DC | PRN
  Administered 2018-08-30: 500 mL

## 2018-08-30 MED ORDER — BUPIVACAINE HCL (PF) 0.25 % IJ SOLN
INTRAMUSCULAR | Status: DC | PRN
  Administered 2018-08-30: 30 mL

## 2018-08-30 MED ORDER — PROPOFOL 10 MG/ML IV BOLUS
INTRAVENOUS | Status: DC | PRN
  Administered 2018-08-30: 130 mg via INTRAVENOUS

## 2018-08-30 MED ORDER — HYDROCODONE-ACETAMINOPHEN 5-325 MG PO TABS: 1.0000 | ORAL_TABLET | ORAL | Status: DC | PRN

## 2018-08-30 MED ORDER — SODIUM CHLORIDE 0.9 % IV SOLN
INTRAVENOUS | Status: DC | PRN
  Administered 2018-08-30: 08:00:00 15 ug/min via INTRAVENOUS

## 2018-08-30 MED ORDER — LEVOTHYROXINE SODIUM 88 MCG PO TABS
88.0000 ug | ORAL_TABLET | Freq: Every day | ORAL | Status: DC
  Administered 2018-08-30 – 2018-08-31 (×2): 88 ug via ORAL
  Filled 2018-08-30 (×2): qty 1

## 2018-08-30 MED ORDER — BUPIVACAINE HCL (PF) 0.25 % IJ SOLN
INTRAMUSCULAR | Status: AC
  Filled 2018-08-30: qty 30

## 2018-08-30 SURGICAL SUPPLY — 17 items
BLANKET WARM UNDERBOD FULL ACC (MISCELLANEOUS) ×3 IMPLANT
CATH MAPPNG PENTARAY F 2-6-2MM (CATHETERS) ×1 IMPLANT
CATH NAVISTAR SMARTTOUCH DF (ABLATOR) ×3 IMPLANT
CATH SOUNDSTAR 3D IMAGING (CATHETERS) ×3 IMPLANT
CATH WEBSTER BI DIR CS D-F CRV (CATHETERS) ×3 IMPLANT
COVER SWIFTLINK CONNECTOR (BAG) ×3 IMPLANT
NEEDLE BAYLIS TRANSSEPTAL 71CM (NEEDLE) ×3 IMPLANT
PACK EP LATEX FREE (CUSTOM PROCEDURE TRAY) ×3
PACK EP LF (CUSTOM PROCEDURE TRAY) ×1 IMPLANT
PAD PRO RADIOLUCENT 2001M-C (PAD) ×3 IMPLANT
PATCH CARTO3 (PAD) ×3 IMPLANT
PENTARAY F 2-6-2MM (CATHETERS) ×3
SHEATH AVANTI 11F 11CM (SHEATH) ×3 IMPLANT
SHEATH PINNACLE 7F 10CM (SHEATH) ×6 IMPLANT
SHEATH PINNACLE VASC 9FR (SHEATH) ×3 IMPLANT
SHEATH SWARTZ TS SL2 63CM 8.5F (SHEATH) ×3 IMPLANT
TUBING SMART ABLATE COOLFLOW (TUBING) ×3 IMPLANT

## 2018-08-30 NOTE — Transfer of Care (Signed)
Immediate Anesthesia Transfer of Care Note  Patient: Paige Jones  Procedure(s) Performed: ATRIAL FIBRILLATION ABLATION (N/A )  Patient Location: Cath Lab  Anesthesia Type:General  Level of Consciousness: awake, alert  and oriented  Airway & Oxygen Therapy: Patient Spontanous Breathing and Patient connected to nasal cannula oxygen  Post-op Assessment: Report given to RN, Post -op Vital signs reviewed and stable and Patient moving all extremities X 4  Post vital signs: Reviewed and stable  Last Vitals:  Vitals Value Taken Time  BP 113/55 08/30/2018 10:06 AM  Temp    Pulse 80 08/30/2018 10:06 AM  Resp 14 08/30/2018 10:06 AM  SpO2 99 % 08/30/2018 10:06 AM  Vitals shown include unvalidated device data.  Last Pain:  Vitals:   08/30/18 0611  TempSrc:   PainSc: 0-No pain         Complications: No apparent anesthesia complications

## 2018-08-30 NOTE — Progress Notes (Signed)
Per Dr [name], patient is to get two doses of Eliquis today.

## 2018-08-30 NOTE — Discharge Instructions (Signed)
Post procedure care instructions No driving for 4 days. No lifting over 5 lbs for 1 week. No vigorous or sexual activity for 1 week. You may return to work on 09/06/2018. Keep procedure site clean & dry. If you notice increased pain, swelling, bleeding or pus, call/return!  You may shower, but no soaking baths/hot tubs/pools for 1 week.   Information on my medicine - ELIQUIS (apixaban)  This medication education was reviewed with me or my healthcare representative as part of my discharge preparation.    Why was Eliquis prescribed for you? Eliquis was prescribed for you to reduce the risk of forming blood clots that can cause a stroke if you have a medical condition called atrial fibrillation (a type of irregular heartbeat) OR to reduce the risk of a blood clots forming after orthopedic surgery.  What do You need to know about Eliquis ? Take your Eliquis TWICE DAILY - one tablet in the morning and one tablet in the evening with or without food.  It would be best to take the doses about the same time each day.  If you have difficulty swallowing the tablet whole please discuss with your pharmacist how to take the medication safely.  Take Eliquis exactly as prescribed by your doctor and DO NOT stop taking Eliquis without talking to the doctor who prescribed the medication.  Stopping may increase your risk of developing a new clot or stroke.  Refill your prescription before you run out.  After discharge, you should have regular check-up appointments with your healthcare provider that is prescribing your Eliquis.  In the future your dose may need to be changed if your kidney function or weight changes by a significant amount or as you get older.  What do you do if you miss a dose? If you miss a dose, take it as soon as you remember on the same day and resume taking twice daily.  Do not take more than one dose of ELIQUIS at the same time.  Important Safety Information A possible side effect of  Eliquis is bleeding. You should call your healthcare provider right away if you experience any of the following: ? Bleeding from an injury or your nose that does not stop. ? Unusual colored urine (red or dark brown) or unusual colored stools (red or black). ? Unusual bruising for unknown reasons. ? A serious fall or if you hit your head (even if there is no bleeding).  Some medicines may interact with Eliquis and might increase your risk of bleeding or clotting while on Eliquis. To help avoid this, consult your healthcare provider or pharmacist prior to using any new prescription or non-prescription medications, including herbals, vitamins, non-steroidal anti-inflammatory drugs (NSAIDs) and supplements.  This website has more information on Eliquis (apixaban): [email or web address].no.

## 2018-08-30 NOTE — Anesthesia Postprocedure Evaluation (Signed)
Anesthesia Post Note  Patient: Paige Jones  Procedure(s) Performed: ATRIAL FIBRILLATION ABLATION (N/A )     Patient location during evaluation: PACU Anesthesia Type: General Level of consciousness: awake and alert Pain management: pain level controlled Vital Signs Assessment: post-procedure vital signs reviewed and stable Respiratory status: spontaneous breathing, nonlabored ventilation, respiratory function stable and patient connected to nasal cannula oxygen Cardiovascular status: blood pressure returned to baseline and stable Postop Assessment: no apparent nausea or vomiting Anesthetic complications: no    Last Vitals:  Vitals:   08/30/18 1209 08/30/18 1238  BP: 119/68 116/69  Pulse: 66 72  Resp: 14   Temp:    SpO2: 98% 97%    Last Pain:  Vitals:   08/30/18 1209  TempSrc:   PainSc: 0-No pain                 Barnet Glasgow

## 2018-08-30 NOTE — Anesthesia Procedure Notes (Signed)
Procedure Name: Intubation Date/Time: 08/30/2018 7:44 AM Performed by: Kyung Rudd, CRNA Pre-anesthesia Checklist: Patient identified, Emergency Drugs available, Suction available and Patient being monitored Patient Re-evaluated:Patient Re-evaluated prior to induction Oxygen Delivery Method: Circle system utilized Preoxygenation: Pre-oxygenation with 100% oxygen Induction Type: IV induction and Rapid sequence Laryngoscope Size: Mac and 3 Grade View: Grade I Tube type: Oral Tube size: 7.0 mm Number of attempts: 1 Airway Equipment and Method: Stylet Placement Confirmation: ETT inserted through vocal cords under direct vision,  positive ETCO2 and breath sounds checked- equal and bilateral Secured at: 21 cm Tube secured with: Tape Dental Injury: Teeth and Oropharynx as per pre-operative assessment

## 2018-08-30 NOTE — Interval H&P Note (Signed)
History and Physical Interval Note:  08/30/2018 7:24 AM  Paige Jones  has presented today for surgery, with the diagnosis of atrial fibrillation.  The various methods of treatment have been discussed with the patient and family. After consideration of risks, benefits and other options for treatment, the patient has consented to  afib ablation as a surgical intervention.  The patient's history has been reviewed, patient examined, no change in status, stable for surgery.  I have reviewed the patient's chart and labs.  Questions were answered to the patient's satisfaction.     She reports compliance with eliquis without interruption.  She is in sinus rhythm today.  Therapeutic strategies for afib including medicine and ablation were discussed in detail with the patient today. Risk, benefits, and alternatives to EP study and radiofrequency ablation for afib were also discussed in detail today. These risks include but are not limited to stroke, bleeding, vascular damage, tamponade, perforation, damage to the esophagus, lungs, and other structures, pulmonary vein stenosis, worsening renal function, and death. The patient understands these risk and wishes to proceed.  As she is in sinus today and has been compliant with anticoagulation, I will evaluate LAA with ICE rather than TEE.  Thompson Grayer MD, [facility] 08/30/2018 7:25 AM

## 2018-08-30 NOTE — Discharge Summary (Addendum)
ELECTROPHYSIOLOGY PROCEDURE DISCHARGE SUMMARY    Patient ID: Paige Jones,  MRN: [id number], DOB/AGE: [id number] 68 y.o.  Admit date: 08/30/2018 Discharge date: 08/31/2018  Primary Care Physician: Chesley Noon, MD  Primary Cardiologist: Dr. Aretta Nip Electrophysiologist: Thompson Grayer, MD  Primary Discharge Diagnosis:  1. Paroxysmal AFib     CHA2DS2Vasc is 3, on Eliquis, appropriately dosed  Secondary Discharge Diagnosis:  1. HTN   Procedures This Admission:   1.  Electrophysiology study and radiofrequency catheter ablation on 08/30/2018 by Dr Thompson Grayer.   This study demonstrated   CONCLUSIONS: 1. Sinus rhythm upon presentation.   2. Intracardiac echo reveals a moderate sized left atrium with four separate pulmonary veins without evidence of pulmonary vein stenosis. 3. Successful electrical isolation and anatomical encircling of all four pulmonary veins with radiofrequency current. 4. No inducible arrhythmias following ablation both on and off of Isuprel 5. No early apparent complications.   Brief HPI: Paige Jones is a 68 y.o. female with a history of paroxysmal atrial fibrillation.  She failed medical therapy with multaq and Flecainide. Risks, benefits, and alternatives to catheter ablation of atrial fibrillation were reviewed with the patient who wished to proceed.     Hospital Course:  The patient was admitted and underwent EPS/RFCA of atrial fibrillation with details as outlined above.  The patient was monitored on telemetry overnight which demonstrated SR.  R groin was without complication on the day of discharge.  The patient feels well this morning, no CP or SOB, she was examined by Dr. [name] and considered to be stable for discharge.  Wound care and restrictions were reviewed with the patient.  The patient will be seen back in the AFib clinic in 4 weeks and Dr [name] in 12 weeks for post ablation follow up.   The patient will continue her Multaq until her  current supply at home is completed with plans to discontinue afterwards.   Physical Exam: Vitals:   08/30/18 1735 08/30/18 1945 08/31/18 0411 08/31/18 0851  BP: 114/79 127/78 122/69 121/68  Pulse: 68 71 74   Resp: 20 16 19    Temp: 97.9 F (36.6 C) 98.6 F (37 C) 98.4 F (36.9 C)   TempSrc: Oral Oral Oral   SpO2: 98% 100% 99% 100%  Weight:   72 kg   Height:        GEN- The patient is well appearing, alert and oriented x 3 today.   HEENT: normocephalic, atraumatic; sclera clear, conjunctiva pink; hearing intact; oropharynx clear; neck supple  Lungs-  CTA b/l, normal work of breathing.  No wheezes, rales, rhonchi Heart-  RRR, no murmurs, rubs or gallops  GI- soft, non-tender, non-distended Extremities- no clubbing, cyanosis, or edema; DP/PT/radial pulses 2+ bilaterally, R groin without hematoma/bruit MS- no significant deformity or atrophy Skin- warm and dry, no rash or lesion Psych- euthymic mood, full affect Neuro- strength and sensation are intact   Labs:   Lab Results  Component Value Date   WBC 8.4 08/26/2018   HGB 12.5 08/26/2018   HCT 38.5 08/26/2018   MCV 90 08/26/2018   PLT 232 08/26/2018    Recent Labs  Lab 08/26/18 0951  NA 141  K 4.2  CL 103  CO2 25  BUN 17  CREATININE 0.95  CALCIUM 9.6  GLUCOSE 125*     Discharge Medications:  Allergies as of 08/31/2018      Reactions   Melatonin Itching   "Keeps me awake"   Pseudoephedrine  Elevated BP   Rosuvastatin    headache   Benadryl [diphenhydramine] Itching   Prednisone    Went into afib      Medication List    TAKE these medications   atorvastatin 20 MG tablet Commonly known as:  LIPITOR TAKE ONE TABLET BY MOUTH ONCE DAILY.   azelastine 0.1 % nasal spray Commonly known as:  ASTELIN Place 1 spray into the nose 2 (two) times daily as needed for allergies.   calcium carbonate 600 MG Tabs tablet Commonly known as:  OS-CAL Take 600 mg by mouth daily.   cetirizine 10 MG tablet  Commonly known as:  ZYRTEC Take 10 mg by mouth at bedtime as needed for allergies.   diclofenac 75 MG EC tablet Commonly known as:  VOLTAREN TAKE (1) TABLET BY MOUTH TWICE DAILY. What changed:  See the new instructions.   diclofenac sodium 1 % Gel Commonly known as:  VOLTAREN Apply 4 g topically 4 (four) times daily. What changed:    when to take this  reasons to take this   dronedarone 400 MG tablet Commonly known as:  Multaq Take 1 tablet (400 mg total) by mouth 2 (two) times daily with a meal. START on 05/22/2018 Notes to patient:  As discussed with Dr. [name], continue this medicine until your currently supply is completed, then stop.   Eliquis 5 MG Tabs tablet Generic drug:  apixaban Take 5 mg by mouth 2 (two) times daily.   fenofibrate 160 MG tablet TAKE ONE TABLET BY MOUTH ONCE DAILY.   Fish Oil 1000 MG Caps Take 1,000 mg by mouth daily.   fluticasone 50 MCG/ACT nasal spray Commonly known as:  FLONASE Place 2 sprays into both nostrils as needed for allergies.   furosemide 20 MG tablet Commonly known as:  LASIX Take 20 mg by mouth as needed for fluid.   ibuprofen 200 MG tablet Commonly known as:  ADVIL Take 400 mg by mouth every 6 (six) hours as needed for headache or moderate pain.   levothyroxine 88 MCG tablet Commonly known as:  SYNTHROID Take 88 mcg by mouth daily.   LUBRICATING EYE DROPS OP Place 1 drop into both eyes daily as needed (dry eyes).   omeprazole 20 MG capsule Commonly known as:  PRILOSEC TAKE (1) CAPSULE BY MOUTH ONCE DAILY. What changed:  See the new instructions.   TURMERIC PO Take 550 mg by mouth daily.   vitamin E 400 UNIT capsule Generic drug:  vitamin E Take 400 Units by mouth daily.       Disposition:  Home  Discharge Instructions    Diet - low sodium heart healthy   Complete by:  As directed    Increase activity slowly   Complete by:  As directed      Follow-up Information    [facility].   Specialty:  Cardiology Why:  09/29/2018 @ 9:30AM, with C. Marlene Lard, PA Contact information: [address] [id number] [place] [phone]       Thompson Grayer, MD Follow up.   Specialty:  Cardiology Why:  11/30/2018 @ 10:45AM Contact information: [place] [address] [phone]           Duration of Discharge Encounter: Greater than 30 minutes including physician time.  Signed, Tommye Standard, PA-C 08/31/2018 8:57 AM   I have seen, examined the patient, and reviewed the above assessment and plan.  Changes to above are  made where necessary.  On exam, RRR.  Doing well post ablation.  DC to home with routine follow-up Stop multaq in 4 weeks.  Co Sign: Thompson Grayer, MD 08/31/2018 11:44 AM

## 2018-08-31 DIAGNOSIS — I10 Essential (primary) hypertension: Secondary | ICD-10-CM | POA: Diagnosis not present

## 2018-08-31 DIAGNOSIS — Z791 Long term (current) use of non-steroidal anti-inflammatories (NSAID): Secondary | ICD-10-CM | POA: Diagnosis not present

## 2018-08-31 DIAGNOSIS — I480 Paroxysmal atrial fibrillation: Secondary | ICD-10-CM

## 2018-08-31 DIAGNOSIS — Z7951 Long term (current) use of inhaled steroids: Secondary | ICD-10-CM | POA: Diagnosis not present

## 2018-08-31 DIAGNOSIS — E785 Hyperlipidemia, unspecified: Secondary | ICD-10-CM | POA: Diagnosis not present

## 2018-08-31 DIAGNOSIS — Z7901 Long term (current) use of anticoagulants: Secondary | ICD-10-CM | POA: Diagnosis not present

## 2018-08-31 DIAGNOSIS — Z79899 Other long term (current) drug therapy: Secondary | ICD-10-CM | POA: Diagnosis not present

## 2018-08-31 DIAGNOSIS — Z888 Allergy status to other drugs, medicaments and biological substances status: Secondary | ICD-10-CM | POA: Diagnosis not present

## 2018-08-31 DIAGNOSIS — Z85828 Personal history of other malignant neoplasm of skin: Secondary | ICD-10-CM | POA: Diagnosis not present

## 2018-09-06 ENCOUNTER — Telehealth: Payer: Self-pay | Admitting: Internal Medicine

## 2018-09-06 NOTE — Telephone Encounter (Signed)
Left detailed message.  Advised a knot post ablation was normal.  Advised to call office if knot appears to be growing rather than shrinking with increased pain increased bruising.  Advised some discomfort and some bruising and the knot was normal and will get better with time.  Advised to call office if any further questions.

## 2018-09-06 NOTE — Telephone Encounter (Signed)
New Message   Patient states that the site area of her ablation is kind of hard. She said that it does not hurt but its sensitive to touch. It feels like a knot. She is just wondering it should it be that way. Please call to discuss.

## 2018-09-07 ENCOUNTER — Ambulatory Visit (HOSPITAL_COMMUNITY)
Admission: RE | Admit: 2018-09-07 | Discharge: 2018-09-07 | Disposition: A | Discharge status: Home / Self Care | Payer: PPO | Source: Ambulatory Visit | Attending: Physician Assistant | Admitting: Physician Assistant

## 2018-09-07 ENCOUNTER — Other Ambulatory Visit: Payer: Self-pay

## 2018-09-07 DIAGNOSIS — Z825 Family history of asthma and other chronic lower respiratory diseases: Secondary | ICD-10-CM | POA: Diagnosis not present

## 2018-09-07 DIAGNOSIS — Z8269 Family history of other diseases of the musculoskeletal system and connective tissue: Secondary | ICD-10-CM | POA: Diagnosis not present

## 2018-09-07 DIAGNOSIS — Z7951 Long term (current) use of inhaled steroids: Secondary | ICD-10-CM | POA: Diagnosis not present

## 2018-09-07 DIAGNOSIS — I10 Essential (primary) hypertension: Secondary | ICD-10-CM | POA: Insufficient documentation

## 2018-09-07 DIAGNOSIS — Z8261 Family history of arthritis: Secondary | ICD-10-CM | POA: Diagnosis not present

## 2018-09-07 DIAGNOSIS — Z833 Family history of diabetes mellitus: Secondary | ICD-10-CM | POA: Diagnosis not present

## 2018-09-07 DIAGNOSIS — I480 Paroxysmal atrial fibrillation: Secondary | ICD-10-CM | POA: Diagnosis not present

## 2018-09-07 DIAGNOSIS — Z888 Allergy status to other drugs, medicaments and biological substances status: Secondary | ICD-10-CM | POA: Insufficient documentation

## 2018-09-07 DIAGNOSIS — Z79899 Other long term (current) drug therapy: Secondary | ICD-10-CM | POA: Insufficient documentation

## 2018-09-07 DIAGNOSIS — E785 Hyperlipidemia, unspecified: Secondary | ICD-10-CM | POA: Insufficient documentation

## 2018-09-07 DIAGNOSIS — Z8349 Family history of other endocrine, nutritional and metabolic diseases: Secondary | ICD-10-CM | POA: Diagnosis not present

## 2018-09-07 DIAGNOSIS — Z79890 Hormone replacement therapy (postmenopausal): Secondary | ICD-10-CM | POA: Insufficient documentation

## 2018-09-07 DIAGNOSIS — Z7901 Long term (current) use of anticoagulants: Secondary | ICD-10-CM | POA: Insufficient documentation

## 2018-09-07 DIAGNOSIS — Z8249 Family history of ischemic heart disease and other diseases of the circulatory system: Secondary | ICD-10-CM | POA: Insufficient documentation

## 2018-09-07 NOTE — Progress Notes (Signed)
Primary Care Physician: Chesley Noon, MD Primary Cardiologist: Dr Bronson Ing Primary Electrophysiologist: Dr [name] Referring Physician: Dr Adaline Sill is a 68 y.o. female with a history of HTN, HLD, and paroxysmal atrial fibrillation who presents for follow up in the [facility]. Patient recently underwent afib ablation with Dr [name] on 08/30/18. She has done well with no heart racing or palpitations. She did note some firmness around her cath site associated with some tenderness. No bleeding or drainage. She also denies CP or swallowing issues.  Today, she denies symptoms of palpitations, chest pain, shortness of breath, orthopnea, PND, lower extremity edema, dizziness, presyncope, syncope, snoring, daytime somnolence, bleeding, or neurologic sequela. The patient is tolerating medications without difficulties and is otherwise without complaint today.    Atrial Fibrillation Risk Factors:  she does not have symptoms or diagnosis of sleep apnea.   she has a BMI of There is no height or weight on file to calculate BMI.. There were no vitals filed for this visit.  Family History  Problem Relation Age of Onset  . Thyroid disease Mother   . Atrial fibrillation Mother   . Fibromyalgia Mother   . Arthritis Mother   . Emphysema Father   . Diabetes Father   . Heart disease Father   . Cancer Father        gall bladder     Atrial Fibrillation Management history:  Previous antiarrhythmic drugs: flecainide, Multaq Previous cardioversions: none Previous ablations: 08/30/18 CHADS2VASC score: 3 Anticoagulation history: Eliquis   Past Medical History:  Diagnosis Date  . Arachnoid cyst   . Arthritis   . Basal cell carcinoma   . Hyperlipidemia   . Hypertension   . Paroxysmal atrial fibrillation (HCC)   . Thyroid goiter    Past Surgical History:  Procedure Laterality Date  . ATRIAL FIBRILLATION ABLATION N/A 08/30/2018   Procedure:  ATRIAL FIBRILLATION ABLATION;  Surgeon: Thompson Grayer, MD;  Location: [facility];  Service: Cardiovascular;  Laterality: N/A;  . BASAL CELL CARCINOMA EXCISION    . HYSTERECTOMY ABDOMINAL WITH SALPINGECTOMY    . TONSILLECTOMY AND ADENOIDECTOMY      Current Outpatient Medications  Medication Sig Dispense Refill  . apixaban (ELIQUIS) 5 MG TABS tablet Take 5 mg by mouth 2 (two) times daily.    Marland Kitchen atorvastatin (LIPITOR) 20 MG tablet TAKE ONE TABLET BY MOUTH ONCE DAILY. (Patient taking differently: Take 20 mg by mouth daily. ) 30 tablet 6  . azelastine (ASTELIN) 0.1 % nasal spray Place 1 spray into the nose 2 (two) times daily as needed for allergies.     . calcium carbonate (OS-CAL) 600 MG TABS tablet Take 600 mg by mouth daily.     . Carboxymethylcellul-Glycerin (LUBRICATING EYE DROPS OP) Place 1 drop into both eyes daily as needed (dry eyes).    . cetirizine (ZYRTEC) 10 MG tablet Take 10 mg by mouth at bedtime as needed for allergies.    Marland Kitchen diclofenac (VOLTAREN) 75 MG EC tablet TAKE (1) TABLET BY MOUTH TWICE DAILY. (Patient taking differently: Take 75 mg by mouth daily. ) 180 tablet 0  . diclofenac sodium (VOLTAREN) 1 % GEL Apply 4 g topically 4 (four) times daily. (Patient taking differently: Apply 4 g topically 4 (four) times daily as needed (pain). ) 100 g 3  . dronedarone (MULTAQ) 400 MG tablet Take 1 tablet (400 mg total) by mouth 2 (two) times daily with a meal. START on 05/22/2018  180 tablet 3  . fenofibrate 160 MG tablet TAKE ONE TABLET BY MOUTH ONCE DAILY. (Patient taking differently: Take 160 mg by mouth daily. ) 90 tablet 0  . fluticasone (FLONASE) 50 MCG/ACT nasal spray Place 2 sprays into both nostrils as needed for allergies.     . furosemide (LASIX) 20 MG tablet Take 20 mg by mouth as needed for fluid.    Marland Kitchen ibuprofen (ADVIL) 200 MG tablet Take 400 mg by mouth every 6 (six) hours as needed for headache or moderate pain.    Marland Kitchen levothyroxine (SYNTHROID) 88 MCG tablet Take 88 mcg by  mouth daily.     . Omega-3 Fatty Acids (FISH OIL) 1000 MG CAPS Take 1,000 mg by mouth daily.     Marland Kitchen omeprazole (PRILOSEC) 20 MG capsule TAKE (1) CAPSULE BY MOUTH ONCE DAILY. (Patient taking differently: Take 20 mg by mouth daily. ) 30 capsule 6  . TURMERIC PO Take 550 mg by mouth daily.    . vitamin E (VITAMIN E) 400 UNIT capsule Take 400 Units by mouth daily.      No current facility-administered medications for this visit.     Allergies  Allergen Reactions  . Melatonin Itching    "Keeps me awake"  . Pseudoephedrine     Elevated BP  . Rosuvastatin     headache  . Benadryl [Diphenhydramine] Itching  . Prednisone     Went into afib    Social History   Socioeconomic History  . Marital status: Married    Spouse name: Not on file  . Number of children: Not on file  . Years of education: Not on file  . Highest education level: Not on file  Occupational History  . Not on file  Social Needs  . Financial resource strain: Not on file  . Food insecurity    Worry: Not on file    Inability: Not on file  . Transportation needs    Medical: Not on file    Non-medical: Not on file  Tobacco Use  . Smoking status: Never Smoker  . Smokeless tobacco: Never Used  Substance and Sexual Activity  . Alcohol use: No    Alcohol/week: 0.0 standard drinks  . Drug use: No  . Sexual activity: Not on file  Lifestyle  . Physical activity    Days per week: Not on file    Minutes per session: Not on file  . Stress: Not on file  Relationships  . Social Herbalist on phone: Not on file    Gets together: Not on file    Attends religious service: Not on file    Active member of club or organization: Not on file    Attends meetings of clubs or organizations: Not on file    Relationship status: Not on file  . Intimate partner violence    Fear of current or ex partner: Not on file    Emotionally abused: Not on file    Physically abused: Not on file    Forced sexual activity: Not on  file  Other Topics Concern  . Not on file  Social History Narrative   Lives in [place] with spouse.   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     ROS- All systems are reviewed and negative except as per the HPI above.  Physical Exam: There were no vitals filed for this visit.  GEN- The patient is well appearing, alert and oriented  x 3 today.   Head- normocephalic, atraumatic Eyes-  Sclera clear, conjunctiva pink Ears- hearing intact Oropharynx- clear Neck- supple  Lungs- Clear to ausculation bilaterally, normal work of breathing Heart- Regular rate and rhythm, no murmurs, rubs or gallops  GI- soft, NT, ND, + BS Extremities- no clubbing, cyanosis, or edema MS- no significant deformity or atrophy Skin- no rash or lesion. Cath site stable, small hematoma with mild ecchymosis noted. Psych- euthymic mood, full affect Neuro- strength and sensation are intact  Wt Readings from Last 3 Encounters:  08/31/18 158 lb 12.8 oz (72 kg)  08/19/18 169 lb (76.7 kg)  05/20/18 169 lb (76.7 kg)    EKG not performed today.  Echo 08/17/18 demonstrated  1. The left ventricle has normal systolic function with an ejection fraction of 60-65%. The cavity size was normal. Left ventricular diastolic parameters were normal.  2. The right ventricle has normal systolic function. The cavity was normal. There is no increase in right ventricular wall thickness.  3. Left atrial size was mildly dilated.  4. No evidence of mitral valve stenosis.  5. The aortic valve is tricuspid. Mild thickening of the aortic valve. Mild calcification of the aortic valve. No stenosis of the aortic valve. Mild aortic annular calcification noted.  6. The aortic root is normal in size and structure.  7. Pulmonary hypertension is indeterminate, inadequate TR jet.  8. The inferior vena cava was dilated in size with >50% respiratory variability.  Epic records are reviewed at length today   Assessment and Plan:  1. Paroxysmal atrial fibrillation The patient has paroxysmal atrial fibrillation. Previously failed flecainide and Multaq. S/p afib ablation with Dr [name] 08/30/18. No heart racing or palpitations.  Continue Eliquis without interruption for at least 3 months.  This patients CHA2DS2-VASc Score and unadjusted Ischemic Stroke Rate (% per year) is equal to 3.2 % stroke rate/year from a score of 3  Above score calculated as 1 point each if present [CHF, HTN, DM, Vascular=MI/PAD/Aortic Plaque, Age if 65-74, or Female] Above score calculated as 2 points each if present [Age > 75, or Stroke/TIA/TE]   2. HTN No changes today.  3. Groin Hematoma Cath site appears stable with small hematoma and ecchymosis. Reassured pt that this should resolve within days to weeks and is normal after an ablation.   Follow up in AF clinic and with Dr [name] as scheduled.   [facility] [address] [place], [place] [address] [phone] 09/07/2018 8:42 AM

## 2018-09-14 DIAGNOSIS — E039 Hypothyroidism, unspecified: Secondary | ICD-10-CM | POA: Diagnosis not present

## 2018-09-26 ENCOUNTER — Other Ambulatory Visit: Payer: Self-pay

## 2018-09-26 ENCOUNTER — Encounter (HOSPITAL_COMMUNITY): Payer: Self-pay | Admitting: Physician Assistant

## 2018-09-26 ENCOUNTER — Ambulatory Visit (HOSPITAL_COMMUNITY)
Admission: RE | Admit: 2018-09-26 | Discharge: 2018-09-26 | Disposition: A | Discharge status: Home / Self Care | Payer: PPO | Source: Ambulatory Visit | Attending: Physician Assistant | Admitting: Physician Assistant

## 2018-09-26 VITALS — BP 126/68 | HR 72 | Ht 62.0 in | Wt 158.0 lb

## 2018-09-26 DIAGNOSIS — Z79899 Other long term (current) drug therapy: Secondary | ICD-10-CM | POA: Diagnosis not present

## 2018-09-26 DIAGNOSIS — Z8249 Family history of ischemic heart disease and other diseases of the circulatory system: Secondary | ICD-10-CM | POA: Diagnosis not present

## 2018-09-26 DIAGNOSIS — Z8349 Family history of other endocrine, nutritional and metabolic diseases: Secondary | ICD-10-CM | POA: Insufficient documentation

## 2018-09-26 DIAGNOSIS — Z825 Family history of asthma and other chronic lower respiratory diseases: Secondary | ICD-10-CM | POA: Insufficient documentation

## 2018-09-26 DIAGNOSIS — E785 Hyperlipidemia, unspecified: Secondary | ICD-10-CM | POA: Insufficient documentation

## 2018-09-26 DIAGNOSIS — Z85828 Personal history of other malignant neoplasm of skin: Secondary | ICD-10-CM | POA: Insufficient documentation

## 2018-09-26 DIAGNOSIS — I10 Essential (primary) hypertension: Secondary | ICD-10-CM | POA: Insufficient documentation

## 2018-09-26 DIAGNOSIS — E049 Nontoxic goiter, unspecified: Secondary | ICD-10-CM | POA: Diagnosis not present

## 2018-09-26 DIAGNOSIS — Z79890 Hormone replacement therapy (postmenopausal): Secondary | ICD-10-CM | POA: Insufficient documentation

## 2018-09-26 DIAGNOSIS — Z888 Allergy status to other drugs, medicaments and biological substances status: Secondary | ICD-10-CM | POA: Diagnosis not present

## 2018-09-26 DIAGNOSIS — Z7901 Long term (current) use of anticoagulants: Secondary | ICD-10-CM | POA: Insufficient documentation

## 2018-09-26 DIAGNOSIS — M1990 Unspecified osteoarthritis, unspecified site: Secondary | ICD-10-CM | POA: Diagnosis not present

## 2018-09-26 DIAGNOSIS — Z833 Family history of diabetes mellitus: Secondary | ICD-10-CM | POA: Diagnosis not present

## 2018-09-26 DIAGNOSIS — I480 Paroxysmal atrial fibrillation: Secondary | ICD-10-CM

## 2018-09-26 NOTE — Progress Notes (Signed)
Primary Care Physician: Chesley Noon, MD Primary Cardiologist: Dr Bronson Ing Primary Electrophysiologist: Dr [name] Referring Physician: Dr Adaline Sill is a 68 y.o. female with a history of HTN, HLD, and paroxysmal atrial fibrillation who presents for follow up in the [facility]. Patient recently underwent afib ablation with Dr [name] on 08/30/18. She continues to do well with no heart racing or palpitations. She also denies CP or swallowing issues. Her small hematoma has resolved.  Today, she denies symptoms of palpitations, chest pain, shortness of breath, orthopnea, PND, lower extremity edema, dizziness, presyncope, syncope, snoring, daytime somnolence, bleeding, or neurologic sequela. The patient is tolerating medications without difficulties and is otherwise without complaint today.    Atrial Fibrillation Risk Factors:  she does not have symptoms or diagnosis of sleep apnea.   she has a BMI of Body mass index is 28.9 kg/m.Marland Kitchen Filed Weights   09/26/18 1358  Weight: 71.7 kg    Family History  Problem Relation Age of Onset  . Thyroid disease Mother   . Atrial fibrillation Mother   . Fibromyalgia Mother   . Arthritis Mother   . Emphysema Father   . Diabetes Father   . Heart disease Father   . Cancer Father        gall bladder     Atrial Fibrillation Management history:  Previous antiarrhythmic drugs: flecainide, Multaq Previous cardioversions: none Previous ablations: 08/30/18 CHADS2VASC score: 3 Anticoagulation history: Eliquis   Past Medical History:  Diagnosis Date  . Arachnoid cyst   . Arthritis   . Basal cell carcinoma   . Hyperlipidemia   . Hypertension   . Paroxysmal atrial fibrillation (HCC)   . Thyroid goiter    Past Surgical History:  Procedure Laterality Date  . ATRIAL FIBRILLATION ABLATION N/A 08/30/2018   Procedure: ATRIAL FIBRILLATION ABLATION;  Surgeon: Thompson Grayer, MD;  Location: [facility];  Service: Cardiovascular;  Laterality: N/A;  . BASAL CELL CARCINOMA EXCISION    . HYSTERECTOMY ABDOMINAL WITH SALPINGECTOMY    . TONSILLECTOMY AND ADENOIDECTOMY      Current Outpatient Medications  Medication Sig Dispense Refill  . apixaban (ELIQUIS) 5 MG TABS tablet Take 5 mg by mouth 2 (two) times daily.    Marland Kitchen atorvastatin (LIPITOR) 20 MG tablet TAKE ONE TABLET BY MOUTH ONCE DAILY. (Patient taking differently: Take 20 mg by mouth daily. ) 30 tablet 6  . azelastine (ASTELIN) 0.1 % nasal spray Place 1 spray into the nose 2 (two) times daily as needed for allergies.     . calcium carbonate (OS-CAL) 600 MG TABS tablet Take 600 mg by mouth daily.     . Carboxymethylcellul-Glycerin (LUBRICATING EYE DROPS OP) Place 1 drop into both eyes daily as needed (dry eyes).    . cetirizine (ZYRTEC) 10 MG tablet Take 10 mg by mouth at bedtime as needed for allergies.    Marland Kitchen diclofenac (VOLTAREN) 75 MG EC tablet TAKE (1) TABLET BY MOUTH TWICE DAILY. (Patient taking differently: Take 75 mg by mouth daily. ) 180 tablet 0  . diclofenac sodium (VOLTAREN) 1 % GEL Apply 4 g topically 4 (four) times daily. (Patient taking differently: Apply 4 g topically 4 (four) times daily as needed (pain). ) 100 g 3  . dronedarone (MULTAQ) 400 MG tablet Take 400 mg by mouth daily.    . fenofibrate 160 MG tablet TAKE ONE TABLET BY MOUTH ONCE DAILY. (Patient taking differently: Take 160 mg by mouth daily. )  90 tablet 0  . fluticasone (FLONASE) 50 MCG/ACT nasal spray Place 2 sprays into both nostrils as needed for allergies.     . furosemide (LASIX) 20 MG tablet Take 20 mg by mouth as needed for fluid.    Marland Kitchen ibuprofen (ADVIL) 200 MG tablet Take 400 mg by mouth every 6 (six) hours as needed for headache or moderate pain.    Marland Kitchen levothyroxine (SYNTHROID) 88 MCG tablet Take 88 mcg by mouth daily.     . Multiple Vitamins-Minerals (CENTRUM SILVER 50+WOMEN) TABS Take by mouth.    . Omega-3 Fatty Acids (FISH OIL) 1000 MG CAPS Take 1,000 mg  by mouth daily.     Marland Kitchen omeprazole (PRILOSEC) 20 MG capsule TAKE (1) CAPSULE BY MOUTH ONCE DAILY. (Patient taking differently: Take 20 mg by mouth daily. ) 30 capsule 6  . TURMERIC PO Take 550 mg by mouth daily.    . vitamin E (VITAMIN E) 400 UNIT capsule Take 400 Units by mouth daily.      No current facility-administered medications for this encounter.     Allergies  Allergen Reactions  . Melatonin Itching    "Keeps me awake"  . Pseudoephedrine     Elevated BP  . Rosuvastatin     headache  . Benadryl [Diphenhydramine] Itching  . Prednisone     Went into afib    Social History   Socioeconomic History  . Marital status: Married    Spouse name: Not on file  . Number of children: Not on file  . Years of education: Not on file  . Highest education level: Not on file  Occupational History  . Not on file  Social Needs  . Financial resource strain: Not on file  . Food insecurity    Worry: Not on file    Inability: Not on file  . Transportation needs    Medical: Not on file    Non-medical: Not on file  Tobacco Use  . Smoking status: Never Smoker  . Smokeless tobacco: Never Used  Substance and Sexual Activity  . Alcohol use: No    Alcohol/week: 0.0 standard drinks  . Drug use: No  . Sexual activity: Not on file  Lifestyle  . Physical activity    Days per week: Not on file    Minutes per session: Not on file  . Stress: Not on file  Relationships  . Social Herbalist on phone: Not on file    Gets together: Not on file    Attends religious service: Not on file    Active member of club or organization: Not on file    Attends meetings of clubs or organizations: Not on file    Relationship status: Not on file  . Intimate partner violence    Fear of current or ex partner: Not on file    Emotionally abused: Not on file    Physically abused: Not on file    Forced sexual activity: Not on file  Other Topics Concern  . Not on file  Social History Narrative    Lives in [place] with spouse.   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     ROS- All systems are reviewed and negative except as per the HPI above.  Physical Exam: Vitals:   09/26/18 1358  BP: 126/68  Pulse: 72  Weight: 71.7 kg  Height: 5\' 2"  (1.575 m)    GEN- The patient is well appearing, alert  and oriented x 3 today.   HEENT-head normocephalic, atraumatic, sclera clear, conjunctiva pink, hearing intact, trachea midline. Lungs- Clear to ausculation bilaterally, normal work of breathing Heart- Regular rate and rhythm, no murmurs, rubs or gallops  GI- soft, NT, ND, + BS Extremities- no clubbing, cyanosis, or edema MS- no significant deformity or atrophy Skin- no rash or lesion Psych- euthymic mood, full affect Neuro- strength and sensation are intact   Wt Readings from Last 3 Encounters:  09/26/18 71.7 kg  08/31/18 72 kg  08/19/18 76.7 kg    EKG performed today demonstrates SR HR 72, PR 154, QRS 66, QTc 444  Echo 08/17/18 demonstrated  1. The left ventricle has normal systolic function with an ejection fraction of 60-65%. The cavity size was normal. Left ventricular diastolic parameters were normal.  2. The right ventricle has normal systolic function. The cavity was normal. There is no increase in right ventricular wall thickness.  3. Left atrial size was mildly dilated.  4. No evidence of mitral valve stenosis.  5. The aortic valve is tricuspid. Mild thickening of the aortic valve. Mild calcification of the aortic valve. No stenosis of the aortic valve. Mild aortic annular calcification noted.  6. The aortic root is normal in size and structure.  7. Pulmonary hypertension is indeterminate, inadequate TR jet.  8. The inferior vena cava was dilated in size with >50% respiratory variability.  Epic records are reviewed at length today  Assessment and Plan:  1. Paroxysmal atrial fibrillation The patient has paroxysmal atrial  fibrillation. Previously failed flecainide and Multaq. S/p afib ablation with Dr [name] 08/30/18. She has done well with no heart racing or palpitations.  Continue Eliquis without interruption for at least 3 months. Patient prefers to stop Multaq at this time.  This patients CHA2DS2-VASc Score and unadjusted Ischemic Stroke Rate (% per year) is equal to 3.2 % stroke rate/year from a score of 3  Above score calculated as 1 point each if present [CHF, HTN, DM, Vascular=MI/PAD/Aortic Plaque, Age if 65-74, or Female] Above score calculated as 2 points each if present [Age > 75, or Stroke/TIA/TE]   2. HTN Stable, no changes today.  3. Groin Hematoma Resolved.   Follow up with Dr [name] as scheduled.   [facility] [address] [place], [place] [address] [phone] 09/26/2018 2:25 PM

## 2018-09-29 ENCOUNTER — Ambulatory Visit (HOSPITAL_COMMUNITY): Payer: PPO | Admitting: Physician Assistant

## 2018-10-10 DIAGNOSIS — Z79890 Hormone replacement therapy (postmenopausal): Secondary | ICD-10-CM | POA: Diagnosis not present

## 2018-10-10 DIAGNOSIS — Z6829 Body mass index (BMI) 29.0-29.9, adult: Secondary | ICD-10-CM | POA: Diagnosis not present

## 2018-10-10 DIAGNOSIS — Z124 Encounter for screening for malignant neoplasm of cervix: Secondary | ICD-10-CM | POA: Diagnosis not present

## 2018-10-10 DIAGNOSIS — Z1231 Encounter for screening mammogram for malignant neoplasm of breast: Secondary | ICD-10-CM | POA: Diagnosis not present

## 2018-10-13 ENCOUNTER — Other Ambulatory Visit: Payer: Self-pay

## 2018-10-13 MED ORDER — OMEPRAZOLE 20 MG PO CPDR: DELAYED_RELEASE_CAPSULE | ORAL | 3 refills | Status: DC

## 2018-11-24 ENCOUNTER — Telehealth: Payer: Self-pay

## 2018-11-24 NOTE — Telephone Encounter (Signed)
Left a message regarding appt on 11/30/18.

## 2018-11-30 ENCOUNTER — Telehealth (INDEPENDENT_AMBULATORY_CARE_PROVIDER_SITE_OTHER): Payer: PPO | Admitting: Internal Medicine

## 2018-11-30 DIAGNOSIS — I480 Paroxysmal atrial fibrillation: Secondary | ICD-10-CM | POA: Diagnosis not present

## 2018-11-30 DIAGNOSIS — I10 Essential (primary) hypertension: Secondary | ICD-10-CM

## 2018-11-30 NOTE — Progress Notes (Signed)
Electrophysiology TeleHealth Note  Due to national recommendations of social distancing due to [place] 19, an audio telehealth visit is felt to be most appropriate for this patient at this time.  Verbal consent was obtained by me for the telehealth visit today.  The patient does not have capability for a virtual visit.  A phone visit is therefore required today.   Date:  11/30/2018   ID:  Paige Jones, DOB 1950-04-08, MRN [id number]  Location: patient's home  Provider location:  [facility]  Evaluation Performed: Follow-up visit  PCP:  Chesley Noon, MD   Electrophysiologist:  Dr [name]  Chief Complaint:  AF follow up  History of Present Illness:    Paige Jones is a 68 y.o. female who presents via telehealth conferencing today.  Since last being seen in our clinic, the patient reports doing very well.   She is pleased with results of ablation.  Denies procedure related complications. Today, she denies symptoms of palpitations, chest pain, shortness of breath,  lower extremity edema, dizziness, presyncope, or syncope.  The patient is otherwise without complaint today.  The patient denies symptoms of fevers, chills, cough, or new SOB worrisome for COVID 19.  Past Medical History:  Diagnosis Date   Arachnoid cyst    Arthritis    Basal cell carcinoma    Hyperlipidemia    Hypertension    Paroxysmal atrial fibrillation (HCC)    Thyroid goiter     Past Surgical History:  Procedure Laterality Date   ATRIAL FIBRILLATION ABLATION N/A 08/30/2018   Procedure: ATRIAL FIBRILLATION ABLATION;  Surgeon: Thompson Grayer, MD;  Location: [facility];  Service: Cardiovascular;  Laterality: N/A;   BASAL CELL CARCINOMA EXCISION     HYSTERECTOMY ABDOMINAL WITH SALPINGECTOMY     TONSILLECTOMY AND ADENOIDECTOMY      Current Outpatient Medications  Medication Sig Dispense Refill   atorvastatin (LIPITOR) 20 MG tablet TAKE ONE TABLET BY MOUTH ONCE DAILY. (Patient taking  differently: Take 20 mg by mouth daily. ) 30 tablet 6   azelastine (ASTELIN) 0.1 % nasal spray Place 1 spray into the nose 2 (two) times daily as needed for allergies.      calcium carbonate (OS-CAL) 600 MG TABS tablet Take 600 mg by mouth daily.      Carboxymethylcellul-Glycerin (LUBRICATING EYE DROPS OP) Place 1 drop into both eyes daily as needed (dry eyes).     cetirizine (ZYRTEC) 10 MG tablet Take 10 mg by mouth at bedtime as needed for allergies.     diclofenac (VOLTAREN) 75 MG EC tablet TAKE (1) TABLET BY MOUTH TWICE DAILY. (Patient taking differently: Take 75 mg by mouth daily. ) 180 tablet 0   diclofenac sodium (VOLTAREN) 1 % GEL Apply 4 g topically 4 (four) times daily. (Patient taking differently: Apply 4 g topically 4 (four) times daily as needed (pain). ) 100 g 3   fenofibrate 160 MG tablet TAKE ONE TABLET BY MOUTH ONCE DAILY. (Patient taking differently: Take 160 mg by mouth daily. ) 90 tablet 0   fluticasone (FLONASE) 50 MCG/ACT nasal spray Place 2 sprays into both nostrils as needed for allergies.      furosemide (LASIX) 20 MG tablet Take 20 mg by mouth as needed for fluid.     ibuprofen (ADVIL) 200 MG tablet Take 400 mg by mouth every 6 (six) hours as needed for headache or moderate pain.     levothyroxine (SYNTHROID) 88 MCG tablet Take 88 mcg by mouth  daily.      Multiple Vitamins-Minerals (CENTRUM SILVER 50+WOMEN) TABS Take by mouth.     Omega-3 Fatty Acids (FISH OIL) 1000 MG CAPS Take 1,000 mg by mouth daily.      omeprazole (PRILOSEC) 20 MG capsule TAKE (1) CAPSULE BY MOUTH ONCE DAILY. 90 capsule 3   TURMERIC PO Take 550 mg by mouth daily.     vitamin E (VITAMIN E) 400 UNIT capsule Take 400 Units by mouth daily.      No current facility-administered medications for this visit.     Allergies:   Melatonin, Pseudoephedrine, Rosuvastatin, Benadryl [diphenhydramine], and Prednisone   Social History:  The patient  reports that she has never smoked. She has never  used smokeless tobacco. She reports that she does not drink alcohol or use drugs.   Family History:  The patient's  family history includes Arthritis in her mother; Atrial fibrillation in her mother; Cancer in her father; Diabetes in her father; Emphysema in her father; Fibromyalgia in her mother; Heart disease in her father; Thyroid disease in her mother.   ROS:  Please see the history of present illness.   All other systems are personally reviewed and negative.    Exam:    Vital Signs:  There were no vitals taken for this visit.  Well sounding, alert and conversant   Labs/Other Tests and Data Reviewed:    Recent Labs: 08/26/2018: BUN 17; Creatinine, Ser 0.95; Hemoglobin 12.5; Platelets 232; Potassium 4.2; Sodium 141   Wt Readings from Last 3 Encounters:  09/26/18 158 lb (71.7 kg)  08/31/18 158 lb 12.8 oz (72 kg)  08/19/18 169 lb (76.7 kg)      ASSESSMENT & PLAN:    1.  paroxysmal afib Well controlled post ablation off multaq chads2vasc score is 3.  She wishes to stop eliquis.  We discussed this at length today.  I have advised ILR to further further manage of her afib post ablation and to guide anticoagulation therapy. She will consider ILR implantation in office.  2. HTN Stable No change required today  Follow-up:  3 months with me If she decides to proceed with ILR in the interim, she will contact my office.  Patient Risk:  after full review of this patients clinical status, I feel that they are at moderate risk at this time.  Today, I have spent 15 minutes with the patient with telehealth technology discussing arrhythmia management .    Army Fossa, MD  11/30/2018 12:14 PM     [place] [address] [phone] (office) [phone] (fax)

## 2018-12-05 DIAGNOSIS — I480 Paroxysmal atrial fibrillation: Secondary | ICD-10-CM | POA: Diagnosis not present

## 2018-12-05 DIAGNOSIS — M542 Cervicalgia: Secondary | ICD-10-CM | POA: Diagnosis not present

## 2018-12-05 DIAGNOSIS — Z0000 Encounter for general adult medical examination without abnormal findings: Secondary | ICD-10-CM | POA: Diagnosis not present

## 2018-12-05 DIAGNOSIS — E038 Other specified hypothyroidism: Secondary | ICD-10-CM | POA: Diagnosis not present

## 2018-12-05 DIAGNOSIS — Z23 Encounter for immunization: Secondary | ICD-10-CM | POA: Diagnosis not present

## 2018-12-05 DIAGNOSIS — E782 Mixed hyperlipidemia: Secondary | ICD-10-CM | POA: Diagnosis not present

## 2018-12-19 ENCOUNTER — Other Ambulatory Visit: Payer: Self-pay

## 2018-12-19 ENCOUNTER — Ambulatory Visit: Payer: PPO | Admitting: Internal Medicine

## 2018-12-19 ENCOUNTER — Encounter: Payer: Self-pay | Admitting: Internal Medicine

## 2018-12-19 VITALS — BP 122/80 | HR 77 | Ht 62.0 in | Wt 158.0 lb

## 2018-12-19 DIAGNOSIS — E782 Mixed hyperlipidemia: Secondary | ICD-10-CM | POA: Diagnosis not present

## 2018-12-19 DIAGNOSIS — I10 Essential (primary) hypertension: Secondary | ICD-10-CM

## 2018-12-19 DIAGNOSIS — E038 Other specified hypothyroidism: Secondary | ICD-10-CM | POA: Diagnosis not present

## 2018-12-19 DIAGNOSIS — I480 Paroxysmal atrial fibrillation: Secondary | ICD-10-CM | POA: Diagnosis not present

## 2018-12-19 DIAGNOSIS — M542 Cervicalgia: Secondary | ICD-10-CM | POA: Diagnosis not present

## 2018-12-19 HISTORY — PX: OTHER SURGICAL HISTORY: SHX169

## 2018-12-19 NOTE — Patient Instructions (Addendum)
Medication Instructions:  Your physician recommends that you continue on your current medications as directed. Please refer to the Current Medication list given to you today.  Labwork: None ordered.  Testing/Procedures: None ordered.  Follow-Up: Your physician recommends that you schedule a follow-up appointment with Dr. [name] on Jan 4 @ 2:15pm  Any Other Special Instructions Will Be Listed Below (If Applicable).   Tissue Adhesive Wound Care Some cuts and wounds can be closed with skin glue (tissue adhesive). Skin glue holds the skin together and helps your wound heal faster. Skin glue goes away on its own as your wound gets better. Follow these instructions at home:  Wound care  Showers are allowed 24 hours after treatment. Do not soak the wound in water. Do not take baths, swim, or use hot tubs. Do not use soaps or creams on your wound.  If a bandage (dressing) was put on the wound: ? Wash your hands with soap and water before you change your bandage. ? Remove the outer bandage in 24 hours. ? Leave the steri strips in place. It will fall off on its own after 7-10 days.  Do not scratch, rub, or pick at the strips.  Do not put tape over the strips. The strips could come off when you take the tape off.  Protect the wound from another injury.  Protect the wound from sun and tanning beds. General instructions  Take over-the-counter and prescription medicines only as told by your doctor.  Keep all follow-up visits as told by your doctor. This is important. Get help right away if:  Your wound is red, puffy (swollen), hot, or tender.  You get a rash after the glue is put on.  You have more pain in the wound.  You have a red streak going away from the wound.  You have yellowish-white fluid (pus) coming from the wound.  You have more bleeding.  You have a fever.  You have chills and you start to shake.  You notice a bad smell coming from the wound.  Your wound or  skin glue breaks open. This information is not intended to replace advice given to you by your health care provider. Make sure you discuss any questions you have with your health care provider. Document Released: 12/17/2007 Document Revised: 02/19/2017 Document Reviewed: 01/31/2016 Elsevier Patient Education  2020 [employer].    If you need a refill on your cardiac medications before your next appointment, please call your pharmacy.

## 2018-12-19 NOTE — Progress Notes (Signed)
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 female who presents today for routine electrophysiology followup.  Since last being seen in our clinic, the patient reports doing very well.  She has rare palpitations.  She worries about risks of anticoagulation and is clear that she wishes to stop anticoagulation at this time.  Today, she denies symptoms of  chest pain, shortness of breath,  lower extremity edema, dizziness, presyncope, or syncope.  The patient is otherwise without complaint today.   Past Medical History:  Diagnosis Date  . Arachnoid cyst   . Arthritis   . Basal cell carcinoma   . Hyperlipidemia   . Hypertension   . Paroxysmal atrial fibrillation (HCC)   . Thyroid goiter    Past Surgical History:  Procedure Laterality Date  . ATRIAL FIBRILLATION ABLATION N/A 08/30/2018   Procedure: ATRIAL FIBRILLATION ABLATION;  Surgeon: Thompson Grayer, MD;  Location: [facility];  Service: Cardiovascular;  Laterality: N/A;  . BASAL CELL CARCINOMA EXCISION    . HYSTERECTOMY ABDOMINAL WITH SALPINGECTOMY    . TONSILLECTOMY AND ADENOIDECTOMY      ROS- all systems are reviewed and negatives except as per HPI above  Current Outpatient Medications  Medication Sig Dispense Refill  . atorvastatin (LIPITOR) 20 MG tablet TAKE ONE TABLET BY MOUTH ONCE DAILY. (Patient taking differently: Take 20 mg by mouth daily. ) 30 tablet 6  . azelastine (ASTELIN) 0.1 % nasal spray Place 1 spray into the nose 2 (two) times daily as needed for allergies.     . calcium carbonate (OS-CAL) 600 MG TABS tablet Take 600 mg by mouth daily.     . Carboxymethylcellul-Glycerin (LUBRICATING EYE DROPS OP) Place 1 drop into both eyes daily as needed (dry eyes).    . cetirizine (ZYRTEC) 10 MG tablet Take 10 mg by mouth at bedtime as needed for allergies.    Marland Kitchen diclofenac (VOLTAREN) 75 MG EC tablet TAKE (1) TABLET BY MOUTH TWICE DAILY. (Patient taking differently: Take 75 mg by mouth  daily. ) 180 tablet 0  . diclofenac sodium (VOLTAREN) 1 % GEL Apply 4 g topically 4 (four) times daily. (Patient taking differently: Apply 4 g topically 4 (four) times daily as needed (pain). ) 100 g 3  . estradiol (ESTRACE) 0.1 MG/GM vaginal cream Place 1 Applicatorful vaginally as directed.    Marland Kitchen estradiol (VIVELLE-DOT) 0.025 MG/24HR Place 1 patch onto the skin 2 (two) times a week.    . fenofibrate 160 MG tablet TAKE ONE TABLET BY MOUTH ONCE DAILY. (Patient taking differently: Take 160 mg by mouth daily. ) 90 tablet 0  . fluticasone (FLONASE) 50 MCG/ACT nasal spray Place 2 sprays into both nostrils as needed for allergies.     . furosemide (LASIX) 20 MG tablet Take 20 mg by mouth as needed for fluid.    Marland Kitchen ibuprofen (ADVIL) 200 MG tablet Take 400 mg by mouth every 6 (six) hours as needed for headache or moderate pain.    Marland Kitchen levothyroxine (SYNTHROID) 88 MCG tablet Take 88 mcg by mouth daily.     . Multiple Vitamins-Minerals (CENTRUM SILVER 50+WOMEN) TABS Take by mouth.    . Omega-3 Fatty Acids (FISH OIL) 1000 MG CAPS Take 1,000 mg by mouth daily.     Marland Kitchen omeprazole (PRILOSEC) 20 MG capsule TAKE (1) CAPSULE BY MOUTH ONCE DAILY. 90 capsule 3  . TURMERIC PO Take 550 mg by mouth daily.    . vitamin E (  VITAMIN E) 400 UNIT capsule Take 400 Units by mouth daily.      No current facility-administered medications for this visit.     Physical Exam: Vitals:   12/19/18 1508  BP: 122/80  Pulse: 77  SpO2: 99%  Weight: 158 lb (71.7 kg)  Height: 5\' 2"  (1.575 m)    GEN- The patient is well appearing, alert and oriented x 3 today.   Head- normocephalic, atraumatic Eyes-  Sclera clear, conjunctiva pink Ears- hearing intact Oropharynx- clear Lungs-  normal work of breathing Heart- Regular rate and rhythm  GI- soft  Extremities- no clubbing, cyanosis, or edema  Wt Readings from Last 3 Encounters:  12/19/18 158 lb (71.7 kg)  09/26/18 158 lb (71.7 kg)  08/31/18 158 lb 12.8 oz (72 kg)    EKG tracing  ordered today is personally reviewed and shows sinus rhythm  Assessment and Plan:  1. Paroxysmal atrial fibrillation Doing well post ablation off AAD therapy She wishes to consider stopping her eliquis Her chads2vasc score is 3.  We have again discussed ILR implantation for long term afib management post ablation and to further evaluate palpitations.  If she has no afib, we will stop eliquis.  If AF returns, we will restart anticoagualtion I think that long term monitoring is essential to guide anticoagulation long term  2. HTN Stable No change required today  Thompson Grayer MD, [facility] 12/19/2018   PROCEDURES:   1. Implantable loop recorder implantation     DESCRIPTION OF PROCEDURE:  Informed written consent was obtained.  The patient required no sedation for the procedure today.  The patients left chest was prepped and draped. Mapping over the patient's chest was performed to identify the appropriate ILR site.  This area was found to be the left parasternal region over the 3rd-4th intercostal space.  The skin overlying this region was infiltrated with lidocaine for local analgesia.  A 0.5-cm incision was made at the implant site.  A subcutaneous ILR pocket was fashioned using a combination of sharp and blunt dissection.  A Medtronic Reveal Linq model [id number] (SN [id number])  implantable loop recorder was then placed into the pocket R waves were very prominent and measured > 0.2 mV. EBL<1 ml.  Steri- Strips and a sterile dressing were then applied.  There were no early apparent complications.     CONCLUSIONS:   1. Successful implantation of a Medtronic Reveal LINQ implantable loop recorder for afib management post ablation and to further evaluate palpitations  2. No early apparent complications.   Thompson Grayer MD, [facility] 12/19/2018 8:38 PM

## 2018-12-20 DIAGNOSIS — E871 Hypo-osmolality and hyponatremia: Secondary | ICD-10-CM | POA: Diagnosis not present

## 2018-12-27 ENCOUNTER — Other Ambulatory Visit: Payer: Self-pay

## 2018-12-27 ENCOUNTER — Other Ambulatory Visit (HOSPITAL_COMMUNITY): Payer: Self-pay | Admitting: Physician Assistant

## 2018-12-27 ENCOUNTER — Ambulatory Visit (HOSPITAL_COMMUNITY)
Admission: RE | Admit: 2018-12-27 | Discharge: 2018-12-27 | Disposition: A | Discharge status: Home / Self Care | Payer: PPO | Source: Ambulatory Visit | Attending: Physician Assistant | Admitting: Physician Assistant

## 2018-12-27 DIAGNOSIS — M542 Cervicalgia: Secondary | ICD-10-CM | POA: Insufficient documentation

## 2018-12-30 DIAGNOSIS — E871 Hypo-osmolality and hyponatremia: Secondary | ICD-10-CM | POA: Diagnosis not present

## 2019-01-02 ENCOUNTER — Ambulatory Visit: Payer: PPO | Admitting: Internal Medicine

## 2019-01-04 DIAGNOSIS — B078 Other viral warts: Secondary | ICD-10-CM | POA: Diagnosis not present

## 2019-01-04 DIAGNOSIS — L570 Actinic keratosis: Secondary | ICD-10-CM | POA: Diagnosis not present

## 2019-01-04 DIAGNOSIS — Z85828 Personal history of other malignant neoplasm of skin: Secondary | ICD-10-CM | POA: Diagnosis not present

## 2019-01-04 DIAGNOSIS — C44519 Basal cell carcinoma of skin of other part of trunk: Secondary | ICD-10-CM | POA: Diagnosis not present

## 2019-01-04 DIAGNOSIS — D225 Melanocytic nevi of trunk: Secondary | ICD-10-CM | POA: Diagnosis not present

## 2019-01-04 DIAGNOSIS — C44712 Basal cell carcinoma of skin of right lower limb, including hip: Secondary | ICD-10-CM | POA: Diagnosis not present

## 2019-01-04 DIAGNOSIS — C44612 Basal cell carcinoma of skin of right upper limb, including shoulder: Secondary | ICD-10-CM | POA: Diagnosis not present

## 2019-01-04 DIAGNOSIS — L820 Inflamed seborrheic keratosis: Secondary | ICD-10-CM | POA: Diagnosis not present

## 2019-01-04 DIAGNOSIS — C44319 Basal cell carcinoma of skin of other parts of face: Secondary | ICD-10-CM | POA: Diagnosis not present

## 2019-01-04 DIAGNOSIS — D485 Neoplasm of uncertain behavior of skin: Secondary | ICD-10-CM | POA: Diagnosis not present

## 2019-01-22 LAB — CUP PACEART REMOTE DEVICE CHECK
Date Time Interrogation Session: 20201031183320
Implantable Pulse Generator Implant Date: 20200928

## 2019-01-23 ENCOUNTER — Ambulatory Visit (INDEPENDENT_AMBULATORY_CARE_PROVIDER_SITE_OTHER): Payer: PPO | Admitting: *Deleted

## 2019-01-23 DIAGNOSIS — I480 Paroxysmal atrial fibrillation: Secondary | ICD-10-CM | POA: Diagnosis not present

## 2019-02-06 ENCOUNTER — Telehealth: Payer: Self-pay

## 2019-02-06 NOTE — Telephone Encounter (Signed)
Left message for patient regarding disconnected monitor.  

## 2019-02-15 NOTE — Progress Notes (Signed)
Carelink Summary Report / Loop Recorder 

## 2019-02-23 ENCOUNTER — Ambulatory Visit (INDEPENDENT_AMBULATORY_CARE_PROVIDER_SITE_OTHER): Payer: PPO | Admitting: *Deleted

## 2019-02-23 DIAGNOSIS — I480 Paroxysmal atrial fibrillation: Secondary | ICD-10-CM | POA: Diagnosis not present

## 2019-02-23 LAB — CUP PACEART REMOTE DEVICE CHECK
Date Time Interrogation Session: 20201203152223
Implantable Pulse Generator Implant Date: 20200928

## 2019-03-07 ENCOUNTER — Encounter (HOSPITAL_COMMUNITY): Payer: Self-pay | Admitting: *Deleted

## 2019-03-07 ENCOUNTER — Other Ambulatory Visit: Payer: Self-pay

## 2019-03-07 ENCOUNTER — Encounter: Payer: Self-pay | Admitting: Urgent Care

## 2019-03-07 ENCOUNTER — Ambulatory Visit
Admission: EM | Admit: 2019-03-07 | Discharge: 2019-03-07 | Disposition: A | Discharge status: Home / Self Care | Payer: PPO | Source: Home / Self Care

## 2019-03-07 ENCOUNTER — Emergency Department (HOSPITAL_COMMUNITY): Payer: PPO

## 2019-03-07 ENCOUNTER — Emergency Department (HOSPITAL_COMMUNITY)
Admission: EM | Admit: 2019-03-07 | Discharge: 2019-03-07 | Disposition: A | Discharge status: Home / Self Care | Payer: PPO | Attending: Emergency Medicine | Admitting: Emergency Medicine

## 2019-03-07 ENCOUNTER — Telehealth: Payer: Self-pay | Admitting: Internal Medicine

## 2019-03-07 DIAGNOSIS — S0083XA Contusion of other part of head, initial encounter: Secondary | ICD-10-CM | POA: Diagnosis not present

## 2019-03-07 DIAGNOSIS — M542 Cervicalgia: Secondary | ICD-10-CM | POA: Diagnosis not present

## 2019-03-07 DIAGNOSIS — R55 Syncope and collapse: Secondary | ICD-10-CM | POA: Insufficient documentation

## 2019-03-07 DIAGNOSIS — W010XXA Fall on same level from slipping, tripping and stumbling without subsequent striking against object, initial encounter: Secondary | ICD-10-CM | POA: Diagnosis not present

## 2019-03-07 DIAGNOSIS — Z79899 Other long term (current) drug therapy: Secondary | ICD-10-CM | POA: Diagnosis not present

## 2019-03-07 DIAGNOSIS — Y92002 Bathroom of unspecified non-institutional (private) residence single-family (private) house as the place of occurrence of the external cause: Secondary | ICD-10-CM | POA: Diagnosis not present

## 2019-03-07 DIAGNOSIS — Y999 Unspecified external cause status: Secondary | ICD-10-CM | POA: Insufficient documentation

## 2019-03-07 DIAGNOSIS — S0993XA Unspecified injury of face, initial encounter: Secondary | ICD-10-CM | POA: Diagnosis not present

## 2019-03-07 DIAGNOSIS — Y9389 Activity, other specified: Secondary | ICD-10-CM | POA: Insufficient documentation

## 2019-03-07 DIAGNOSIS — S199XXA Unspecified injury of neck, initial encounter: Secondary | ICD-10-CM | POA: Diagnosis not present

## 2019-03-07 DIAGNOSIS — I10 Essential (primary) hypertension: Secondary | ICD-10-CM | POA: Diagnosis not present

## 2019-03-07 DIAGNOSIS — S0990XA Unspecified injury of head, initial encounter: Secondary | ICD-10-CM | POA: Diagnosis not present

## 2019-03-07 LAB — BASIC METABOLIC PANEL
Anion gap: 7 (ref 5–15)
BUN: 15 mg/dL (ref 8–23)
CO2: 28 mmol/L (ref 22–32)
Calcium: 9.6 mg/dL (ref 8.9–10.3)
Chloride: 106 mmol/L (ref 98–111)
Creatinine, Ser: 0.68 mg/dL (ref 0.44–1.00)
GFR calc Af Amer: >60 mL/min (ref >60)
GFR calc non Af Amer: >60 mL/min (ref >60)
Glucose, Bld: 108 mg/dL — ABNORMAL HIGH (ref 70–99)
Potassium: 4.1 mmol/L (ref 3.5–5.1)
Sodium: 141 mmol/L (ref 135–145)

## 2019-03-07 LAB — URINALYSIS, ROUTINE W REFLEX MICROSCOPIC
Bilirubin Urine: NEGATIVE
Glucose, UA: NEGATIVE mg/dL
Hgb urine dipstick: NEGATIVE
Ketones, ur: NEGATIVE mg/dL
Leukocytes,Ua: NEGATIVE
Nitrite: NEGATIVE
Protein, ur: NEGATIVE mg/dL
Specific Gravity, Urine: 1.003 — ABNORMAL LOW (ref 1.005–1.030)
pH: 6 (ref 5.0–8.0)

## 2019-03-07 LAB — CBC WITH DIFFERENTIAL/PLATELET
Abs Immature Granulocytes: 0.05 10*3/uL (ref 0.00–0.07)
Basophils Absolute: 0 10*3/uL (ref 0.0–0.1)
Basophils Relative: 0 %
Eosinophils Absolute: 0.1 10*3/uL (ref 0.0–0.5)
Eosinophils Relative: 1 %
HCT: 40.4 % (ref 36.0–46.0)
Hemoglobin: 13 g/dL (ref 12.0–15.0)
Immature Granulocytes: 1 %
Lymphocytes Relative: 21 %
Lymphs Abs: 2.3 10*3/uL (ref 0.7–4.0)
MCH: 29.1 pg (ref 26.0–34.0)
MCHC: 32.2 g/dL (ref 30.0–36.0)
MCV: 90.6 fL (ref 80.0–100.0)
Monocytes Absolute: 0.9 10*3/uL (ref 0.1–1.0)
Monocytes Relative: 8 %
Neutro Abs: 7.7 10*3/uL (ref 1.7–7.7)
Neutrophils Relative %: 69 %
Platelets: 262 10*3/uL (ref 150–400)
RBC: 4.46 MIL/uL (ref 3.87–5.11)
RDW: 13.3 % (ref 11.5–15.5)
WBC: 11 10*3/uL — ABNORMAL HIGH (ref 4.0–10.5)
nRBC: 0 % (ref 0.0–0.2)

## 2019-03-07 MED ORDER — ACETAMINOPHEN 325 MG PO TABS
650.0000 mg | ORAL_TABLET | Freq: Once | ORAL | Status: AC
  Administered 2019-03-07: 650 mg via ORAL
  Filled 2019-03-07: qty 2

## 2019-03-07 NOTE — Telephone Encounter (Signed)
Vagal syncope noted.  No programming change required at this time.

## 2019-03-07 NOTE — Discharge Instructions (Addendum)
Your lab tests and CT imaging are reassuring tonight with no significant injury from your fall, including no nasal fracture.  You may benefit from short treatments of ice to the nose to help with swelling.  Plan to get rechecked by your primary MD if you have any persistent symptoms.

## 2019-03-07 NOTE — ED Notes (Signed)
Pt ambulatory to waiting room. Pt verbalized understanding of discharge instructions.   

## 2019-03-07 NOTE — ED Triage Notes (Signed)
Passed out in the toilet this am. [place] and hit face, facial trauma noted, swelling to nose and forehead

## 2019-03-07 NOTE — Telephone Encounter (Addendum)
Manual transmission sent by patient due to syncopal episode. No Tachy or AF episodes recorded. Patient had stomach pain and went to the BR around 1:30 - 2:00 am. She reports having a "funny feeling" come over her and then woke up on the floor after hitting the wall face first. She reports no CP, no SOB, no palpitations , no diaphoresis and no dizziness. Patient to follow-up with PCP. Will notify  Dr [name] to assess if pause and brady detection need to be activated, this detection is off as patient was implanted for AF management.

## 2019-03-07 NOTE — Telephone Encounter (Signed)
New Message     Pts husband Is calling and says that the Pt fell last night between 130-2AM. She blacked out and felt light headed, pt cut her nose and she didn't really know what happened. Pt said her stomach was hurting and felt nauseous  Pts husband says she has a loop recorder and is wondering if it can be checked    Please call

## 2019-03-07 NOTE — ED Triage Notes (Signed)
Also has pain in neck with movement

## 2019-03-07 NOTE — ED Provider Notes (Signed)
[facility] EMERGENCY DEPARTMENT Provider Note   CSN: [id number] Arrival date & time: 03/07/19  1622     History Chief Complaint  Patient presents with  . Loss of Consciousness    Paige Jones is a 68 y.o. female with a history significant for paroxysmal afib with ablation procedure August 30, 2018 presenting with a syncopal event that occurred last night.  She has occasional constipation and reports woke around 1 am with severe abdominal pain and sensation of needing to have a bm.  She was sitting on the toilet when she developed diaphoresis, felt weak, lightheaded, and woke on the floor with blood on the floor, suspected from a wound to her nasal bridge.  She hit her forehead and nose on the wall or floor and has had a moderate headache since.  She reports neck pain, but has been able to move her head and neck without much discomfort.  She describes similar event in the past with episodes of abdominal pain but never to the point of syncope.  She has had a bm today and denies abdominal pain at this time.  She denies having chest pain, palpitations or sob during the event.  She wears a loop monitor which was interrogated by her cardiology office this am confirming no arhythmia as the source of this event. She has no other complaint of pain or injury to extremities or back.   The history is provided by the patient and the spouse.       Past Medical History:  Diagnosis Date  . Arachnoid cyst   . Arthritis   . Basal cell carcinoma   . Hyperlipidemia   . Hypertension   . Paroxysmal atrial fibrillation (HCC)   . Thyroid goiter     Patient Active Problem List   Diagnosis Date Noted  . Paroxysmal atrial fibrillation ([place]) 08/30/2018  . Hyperlipidemia 07/17/2016  . Hypothyroid 07/17/2016  . Allergic rhinitis 07/17/2016  . GERD (gastroesophageal reflux disease) 07/17/2016  . Localized edema 07/17/2016  . Intracranial arachnoid cyst 06/03/2015  . Mallet deformity of third finger,  right acquired 05/07/2015  . Primary osteoarthritis of first carpometacarpal joint of right hand 05/07/2015  . Pain in joint, pelvic region and thigh 02/03/2011    Past Surgical History:  Procedure Laterality Date  . ATRIAL FIBRILLATION ABLATION N/A 08/30/2018   Procedure: ATRIAL FIBRILLATION ABLATION;  Surgeon: Thompson Grayer, MD;  Location: [facility];  Service: Cardiovascular;  Laterality: N/A;  . BASAL CELL CARCINOMA EXCISION    . HYSTERECTOMY ABDOMINAL WITH SALPINGECTOMY    . implantable loop recorder implantation  12/19/2018    [employer] Reveal Bassett model [id number] (SN [id number])  implantable loop recorder implanted by Dr [name] in office for afib management post ablation  . TONSILLECTOMY AND ADENOIDECTOMY       OB History   No obstetric history on file.     Family History  Problem Relation Age of Onset  . Thyroid disease Mother   . Atrial fibrillation Mother   . Fibromyalgia Mother   . Arthritis Mother   . Emphysema Father   . Diabetes Father   . Heart disease Father   . Cancer Father        gall bladder    Social History   Tobacco Use  . Smoking status: Never Smoker  . Smokeless tobacco: Never Used  Substance Use Topics  . Alcohol use: No    Alcohol/week: 0.0 standard drinks  . Drug use: No  Home Medications Prior to Admission medications   Medication Sig Start Date End Date Taking? Authorizing Provider  atorvastatin (LIPITOR) 20 MG tablet TAKE ONE TABLET BY MOUTH ONCE DAILY. Patient taking differently: Take 20 mg by mouth daily.  08/03/16   Midge Minium, MD  azelastine (ASTELIN) 0.1 % nasal spray Place 1 spray into the nose 2 (two) times daily as needed for allergies.  07/10/18   [provider]  calcium carbonate (OS-CAL) 600 MG TABS tablet Take 600 mg by mouth daily.     [provider]  Carboxymethylcellul-Glycerin (LUBRICATING EYE DROPS OP) Place 1 drop into both eyes daily as needed (dry eyes).    [provider]    cetirizine (ZYRTEC) 10 MG tablet Take 10 mg by mouth at bedtime as needed for allergies.    [provider]  diclofenac (VOLTAREN) 75 MG EC tablet TAKE (1) TABLET BY MOUTH TWICE DAILY. Patient taking differently: Take 75 mg by mouth daily.  06/28/17   Midge Minium, MD  diclofenac sodium (VOLTAREN) 1 % GEL Apply 4 g topically 4 (four) times daily. Patient taking differently: Apply 4 g topically 4 (four) times daily as needed (pain).  07/17/16   Midge Minium, MD  estradiol (ESTRACE) 0.1 MG/GM vaginal cream Place 1 Applicatorful vaginally as directed. 10/12/18   [provider]  estradiol (VIVELLE-DOT) 0.025 MG/24HR Place 1 patch onto the skin 2 (two) times a week.    [provider]  fenofibrate 160 MG tablet TAKE ONE TABLET BY MOUTH ONCE DAILY. Patient taking differently: Take 160 mg by mouth daily.  09/08/16   Midge Minium, MD  fluticasone (FLONASE) 50 MCG/ACT nasal spray Place 2 sprays into both nostrils as needed for allergies.  07/11/18   [provider]  furosemide (LASIX) 20 MG tablet Take 20 mg by mouth as needed for fluid.    [provider]  ibuprofen (ADVIL) 200 MG tablet Take 400 mg by mouth every 6 (six) hours as needed for headache or moderate pain.    [provider]  levothyroxine (SYNTHROID) 88 MCG tablet Take 88 mcg by mouth daily.  08/16/18   [provider]  Multiple Vitamins-Minerals (CENTRUM SILVER 50+WOMEN) TABS Take by mouth.    [provider]  Omega-3 Fatty Acids (FISH OIL) 1000 MG CAPS Take 1,000 mg by mouth daily.     [provider]  omeprazole (PRILOSEC) 20 MG capsule TAKE (1) CAPSULE BY MOUTH ONCE DAILY. 10/13/18   Allred, Jeneen Rinks, MD  TURMERIC PO Take 550 mg by mouth daily.    [provider]  vitamin E (VITAMIN E) 400 UNIT capsule Take 400 Units by mouth daily.     [provider]    Allergies    Melatonin, Pseudoephedrine, Rosuvastatin, Benadryl  [diphenhydramine], and Prednisone  Review of Systems   Review of Systems  Constitutional: Negative for chills.  HENT: Positive for congestion and facial swelling.   Eyes: Negative.   Respiratory: Negative for chest tightness.   Gastrointestinal: Positive for abdominal pain and constipation.  Genitourinary: Negative.   Musculoskeletal: Positive for neck pain. Negative for arthralgias and joint swelling.  Skin: Positive for wound. Negative for rash.  Neurological: Positive for syncope and light-headedness. Negative for numbness.  Psychiatric/Behavioral: Negative.     Physical Exam Updated Vital Signs BP 139/71   Pulse 66   Temp 98.2 F (36.8 C) (Oral)   Resp 11   SpO2 100%   Physical Exam Vitals and nursing note  reviewed.  Constitutional:      Appearance: She is well-developed.  HENT:     Head: Normocephalic. Abrasion and contusion present.     Jaw: There is normal jaw occlusion.     Comments: Contusion and abrasions noted mid forehead and across nasal bridge.  No septal hematoma.     Right Ear: Tympanic membrane normal.     Left Ear: Tympanic membrane normal.     Mouth/Throat:     Mouth: Mucous membranes are moist.  Eyes:     General: Lids are normal.     Extraocular Movements: Extraocular movements intact.     Conjunctiva/sclera: Conjunctivae normal.     Pupils: Pupils are equal, round, and reactive to light.  Neck:     Comments: Paracervical ttp, no appreciable midline cervical pain.  Cardiovascular:     Rate and Rhythm: Normal rate and regular rhythm.     Heart sounds: Normal heart sounds. No murmur.  Pulmonary:     Effort: Pulmonary effort is normal.     Breath sounds: Normal breath sounds. No wheezing or rales.  Musculoskeletal:        General: No deformity. Normal range of motion.     Cervical back: Normal range of motion. No pain with movement.  Skin:    General: Skin is warm and dry.  Neurological:     General: No focal deficit present.     Mental  Status: She is alert and oriented to person, place, and time.     ED Results / Procedures / Treatments   Labs (all labs ordered are listed, but only abnormal results are displayed) Labs Reviewed  URINALYSIS, ROUTINE W REFLEX MICROSCOPIC - Abnormal; Notable for the following components:      Result Value   Color, Urine COLORLESS (*)    Specific Gravity, Urine 1.003 (*)    All other components within normal limits  CBC WITH DIFFERENTIAL/PLATELET - Abnormal; Notable for the following components:   WBC 11.0 (*)    All other components within normal limits  BASIC METABOLIC PANEL - Abnormal; Notable for the following components:   Glucose, Bld 108 (*)    All other components within normal limits    EKG EKG Interpretation  Date/Time:  Tuesday March 07 2019 18:06:49 EST Ventricular Rate:  69 PR Interval:    QRS Duration: 75 QT Interval:  397 QTC Calculation: 426 R Axis:   57 Text Interpretation: Sinus rhythm Normal ECG Confirmed by Veryl Speak [phone]) on 03/07/2019 6:13:55 PM   Radiology CT Head Wo Contrast  Result Date: 03/07/2019 CLINICAL DATA:  Syncope and fall today with a blow to the face. Initial encounter. EXAM: CT HEAD WITHOUT CONTRAST CT MAXILLOFACIAL WITHOUT CONTRAST CT CERVICAL SPINE WITHOUT CONTRAST TECHNIQUE: Multidetector CT imaging of the head, cervical spine, and maxillofacial structures were performed using the standard protocol without intravenous contrast. Multiplanar CT image reconstructions of the cervical spine and maxillofacial structures were also generated. COMPARISON:  Brain MRI 01/31/2017. FINDINGS: CT HEAD FINDINGS Brain: No evidence of acute infarction, hemorrhage, hydrocephalus, extra-axial collection or mass lesion/mass effect. Small arachnoid cyst in the left middle cranial fossa is unchanged. Vascular: No hyperdense vessel or unexpected calcification. Skull: Normal. Negative for fracture or focal lesion. Other: None. CT MAXILLOFACIAL FINDINGS  Osseous: No fracture or mandibular dislocation. No destructive process. Orbits: Status post cataract surgery.  Otherwise negative. Sinuses: Clear. Soft tissues: Negative CT CERVICAL SPINE FINDINGS Alignment: Facet degenerative change results in trace anterolisthesis C4 on C5. Straightening of lordosis  incidentally noted. Skull base and vertebrae: No acute fracture. No primary bone lesion or focal pathologic process. Soft tissues and spinal canal: No prevertebral fluid or swelling. No visible canal hematoma. Disc levels: There is loss of disc space height at C5-6 and C6-7. Scattered facet arthropathy appears worst on the left at C3-4 and C4-5. Upper chest: Clear. Other: None. IMPRESSION: No acute abnormality head, face or cervical spine. Small, benign arachnoid cyst left middle cranial fossa is unchanged. Mid cervical degenerative disease. Electronically Signed   By: Inge Rise M.D.   On: 03/07/2019 18:24   CT Cervical Spine Wo Contrast  Result Date: 03/07/2019 CLINICAL DATA:  Syncope and fall today with a blow to the face. Initial encounter. EXAM: CT HEAD WITHOUT CONTRAST CT MAXILLOFACIAL WITHOUT CONTRAST CT CERVICAL SPINE WITHOUT CONTRAST TECHNIQUE: Multidetector CT imaging of the head, cervical spine, and maxillofacial structures were performed using the standard protocol without intravenous contrast. Multiplanar CT image reconstructions of the cervical spine and maxillofacial structures were also generated. COMPARISON:  Brain MRI 01/31/2017. FINDINGS: CT HEAD FINDINGS Brain: No evidence of acute infarction, hemorrhage, hydrocephalus, extra-axial collection or mass lesion/mass effect. Small arachnoid cyst in the left middle cranial fossa is unchanged. Vascular: No hyperdense vessel or unexpected calcification. Skull: Normal. Negative for fracture or focal lesion. Other: None. CT MAXILLOFACIAL FINDINGS Osseous: No fracture or mandibular dislocation. No destructive process. Orbits: Status post cataract  surgery.  Otherwise negative. Sinuses: Clear. Soft tissues: Negative CT CERVICAL SPINE FINDINGS Alignment: Facet degenerative change results in trace anterolisthesis C4 on C5. Straightening of lordosis incidentally noted. Skull base and vertebrae: No acute fracture. No primary bone lesion or focal pathologic process. Soft tissues and spinal canal: No prevertebral fluid or swelling. No visible canal hematoma. Disc levels: There is loss of disc space height at C5-6 and C6-7. Scattered facet arthropathy appears worst on the left at C3-4 and C4-5. Upper chest: Clear. Other: None. IMPRESSION: No acute abnormality head, face or cervical spine. Small, benign arachnoid cyst left middle cranial fossa is unchanged. Mid cervical degenerative disease. Electronically Signed   By: Inge Rise M.D.   On: 03/07/2019 18:24   CT Maxillofacial Wo Contrast  Result Date: 03/07/2019 CLINICAL DATA:  Syncope and fall today with a blow to the face. Initial encounter. EXAM: CT HEAD WITHOUT CONTRAST CT MAXILLOFACIAL WITHOUT CONTRAST CT CERVICAL SPINE WITHOUT CONTRAST TECHNIQUE: Multidetector CT imaging of the head, cervical spine, and maxillofacial structures were performed using the standard protocol without intravenous contrast. Multiplanar CT image reconstructions of the cervical spine and maxillofacial structures were also generated. COMPARISON:  Brain MRI 01/31/2017. FINDINGS: CT HEAD FINDINGS Brain: No evidence of acute infarction, hemorrhage, hydrocephalus, extra-axial collection or mass lesion/mass effect. Small arachnoid cyst in the left middle cranial fossa is unchanged. Vascular: No hyperdense vessel or unexpected calcification. Skull: Normal. Negative for fracture or focal lesion. Other: None. CT MAXILLOFACIAL FINDINGS Osseous: No fracture or mandibular dislocation. No destructive process. Orbits: Status post cataract surgery.  Otherwise negative. Sinuses: Clear. Soft tissues: Negative CT CERVICAL SPINE FINDINGS  Alignment: Facet degenerative change results in trace anterolisthesis C4 on C5. Straightening of lordosis incidentally noted. Skull base and vertebrae: No acute fracture. No primary bone lesion or focal pathologic process. Soft tissues and spinal canal: No prevertebral fluid or swelling. No visible canal hematoma. Disc levels: There is loss of disc space height at C5-6 and C6-7. Scattered facet arthropathy appears worst on the left at C3-4 and C4-5. Upper chest: Clear. Other: None. IMPRESSION: No acute abnormality  head, face or cervical spine. Small, benign arachnoid cyst left middle cranial fossa is unchanged. Mid cervical degenerative disease. Electronically Signed   By: Inge Rise M.D.   On: 03/07/2019 18:24    Procedures Procedures (including critical care time)  Medications Ordered in ED Medications  acetaminophen (TYLENOL) tablet 650 mg (650 mg Oral Given 03/07/19 1758)    ED Course  I have reviewed the triage vital signs and the nursing notes.  Pertinent labs & imaging results that were available during my care of the patient were reviewed by me and considered in my medical decision making (see chart for details).    MDM Rules/Calculators/A&P                      Imaging and labs viewed, reviewed and discussed findings with pt.  No significant injuries from the event.  Pt had prodrome prior to syncopal event suggesting vasovagal event, probably triggered by abd pain which is now resolved. No orthostasis, no anemia, no evidence for dehydration.   Loop recorder reviewed by cardiology this am, no arrhythmia or suggestion of cardiac syncope.  Discussed important of lying or sitting down if she experiences further sx to avoid injury. Plan f/u with pcp as needed.   Final Clinical Impression(s) / ED Diagnoses Final diagnoses:  Vasovagal syncope  Facial contusion, initial encounter    Rx / DC Orders ED Discharge Orders    None       Landis Martins 03/08/19 [id number]    Veryl Speak, MD 03/08/19 1506

## 2019-03-20 DIAGNOSIS — L905 Scar conditions and fibrosis of skin: Secondary | ICD-10-CM | POA: Diagnosis not present

## 2019-03-20 DIAGNOSIS — C44519 Basal cell carcinoma of skin of other part of trunk: Secondary | ICD-10-CM | POA: Diagnosis not present

## 2019-03-20 DIAGNOSIS — Z85828 Personal history of other malignant neoplasm of skin: Secondary | ICD-10-CM | POA: Diagnosis not present

## 2019-03-20 DIAGNOSIS — L578 Other skin changes due to chronic exposure to nonionizing radiation: Secondary | ICD-10-CM | POA: Diagnosis not present

## 2019-03-20 DIAGNOSIS — C44619 Basal cell carcinoma of skin of left upper limb, including shoulder: Secondary | ICD-10-CM | POA: Diagnosis not present

## 2019-03-20 DIAGNOSIS — C44529 Squamous cell carcinoma of skin of other part of trunk: Secondary | ICD-10-CM | POA: Diagnosis not present

## 2019-03-20 DIAGNOSIS — D485 Neoplasm of uncertain behavior of skin: Secondary | ICD-10-CM | POA: Diagnosis not present

## 2019-03-21 NOTE — Progress Notes (Signed)
ILR remote 

## 2019-03-23 ENCOUNTER — Other Ambulatory Visit: Payer: Self-pay

## 2019-03-23 ENCOUNTER — Ambulatory Visit
Admission: EM | Admit: 2019-03-23 | Discharge: 2019-03-23 | Disposition: A | Discharge status: Home / Self Care | Payer: PPO | Attending: Emergency Medicine | Admitting: Emergency Medicine

## 2019-03-23 DIAGNOSIS — Z20822 Contact with and (suspected) exposure to covid-19: Secondary | ICD-10-CM

## 2019-03-23 DIAGNOSIS — J0140 Acute pansinusitis, unspecified: Secondary | ICD-10-CM

## 2019-03-23 DIAGNOSIS — Z20828 Contact with and (suspected) exposure to other viral communicable diseases: Secondary | ICD-10-CM

## 2019-03-23 MED ORDER — AMOXICILLIN-POT CLAVULANATE 875-125 MG PO TABS: 1.0000 | ORAL_TABLET | Freq: Two times a day (BID) | ORAL | 0 refills | Status: DC

## 2019-03-23 MED ORDER — DOXYCYCLINE HYCLATE 100 MG PO CAPS: 100.0000 mg | ORAL_CAPSULE | Freq: Two times a day (BID) | ORAL | 0 refills | Status: DC

## 2019-03-23 NOTE — ED Triage Notes (Signed)
Pt has had sinus symptoms for past 3 weeks without improvement

## 2019-03-23 NOTE — Discharge Instructions (Addendum)
COVID testing ordered.  It will take between 5-7 days for test results.  Someone will contact you regarding abnormal results.    In the meantime: You should remain isolated in your home for 10 days from symptom onset AND greater than 72 hours after symptoms resolution (absence of fever without the use of fever-reducing medication and improvement in respiratory symptoms), whichever is longer Get plenty of rest and push fluids Doxycycline for sinus infection; cannot tolerate augmentin due to nausea Use OTC zyrtec for nasal congestion, runny nose, and/or sore throat Use OTC flonase for nasal congestion and runny nose Use medications daily for symptom relief Use OTC medications like ibuprofen or tylenol as needed fever or pain Call or go to the ED if you have any new or worsening symptoms such as fever, worsening cough, shortness of breath, chest tightness, chest pain, turning blue, changes in mental status, etc..Marland Kitchen

## 2019-03-23 NOTE — ED Provider Notes (Signed)
[place]   [id number] 03/23/19 Arrival Time: [id number]   CC: Sinus congestion/ pain  SUBJECTIVE: History from: patient.  Paige Jones is a 68 y.o. female who presents with ethmoid and maxillary sinus pain, congestion, and pressure x 3 weeks.  Denies sick exposure to COVID, flu or strep.  Denies recent travel.  States she did black out and hit face prior to symptoms.  Was seen in the ED and had negative CT scan.  Has tried OTC medications without relief.  Symptoms are made worse to the touch.  Reports previous symptoms in the past related to sinus infection.   Denies fever, chills, fatigue, sore throat, cough, SOB, wheezing, chest pain, nausea, changes in bowel or bladder habits.    ROS: As per HPI.  All other pertinent ROS negative.     Past Medical History:  Diagnosis Date  . Arachnoid cyst   . Arthritis   . Basal cell carcinoma   . Hyperlipidemia   . Hypertension   . Paroxysmal atrial fibrillation (HCC)   . Thyroid goiter    Past Surgical History:  Procedure Laterality Date  . ATRIAL FIBRILLATION ABLATION N/A 08/30/2018   Procedure: ATRIAL FIBRILLATION ABLATION;  Surgeon: Thompson Grayer, MD;  Location: [facility];  Service: Cardiovascular;  Laterality: N/A;  . BASAL CELL CARCINOMA EXCISION    . HYSTERECTOMY ABDOMINAL WITH SALPINGECTOMY    . implantable loop recorder implantation  12/19/2018    Medtronic Reveal [place] model [id number] (SN [id number])  implantable loop recorder implanted by Dr [name] in office for afib management post ablation  . TONSILLECTOMY AND ADENOIDECTOMY     Allergies  Allergen Reactions  . Melatonin Itching    "Keeps me awake"  . Pseudoephedrine     Elevated BP  . Rosuvastatin     headache  . Benadryl [Diphenhydramine] Itching  . Prednisone     Went into afib   No current facility-administered medications on file prior to encounter.   Current Outpatient Medications on File Prior to Encounter  Medication Sig Dispense Refill  .  atorvastatin (LIPITOR) 20 MG tablet TAKE ONE TABLET BY MOUTH ONCE DAILY. (Patient taking differently: Take 20 mg by mouth daily. ) 30 tablet 6  . azelastine (ASTELIN) 0.1 % nasal spray Place 1 spray into the nose 2 (two) times daily as needed for allergies.     . calcium carbonate (OS-CAL) 600 MG TABS tablet Take 600 mg by mouth daily.     . Carboxymethylcellul-Glycerin (LUBRICATING EYE DROPS OP) Place 1 drop into both eyes daily as needed (dry eyes).    . cetirizine (ZYRTEC) 10 MG tablet Take 10 mg by mouth at bedtime as needed for allergies.    Marland Kitchen diclofenac (VOLTAREN) 75 MG EC tablet TAKE (1) TABLET BY MOUTH TWICE DAILY. (Patient taking differently: Take 75 mg by mouth daily. ) 180 tablet 0  . diclofenac sodium (VOLTAREN) 1 % GEL Apply 4 g topically 4 (four) times daily. (Patient taking differently: Apply 4 g topically 4 (four) times daily as needed (pain). ) 100 g 3  . estradiol (ESTRACE) 0.1 MG/GM vaginal cream Place 1 Applicatorful vaginally as directed.    Marland Kitchen estradiol (VIVELLE-DOT) 0.025 MG/24HR Place 1 patch onto the skin 2 (two) times a week.    . fenofibrate 160 MG tablet TAKE ONE TABLET BY MOUTH ONCE DAILY. (Patient taking differently: Take 160 mg by mouth daily. ) 90 tablet 0  . fluticasone (FLONASE) 50 MCG/ACT nasal spray Place 2 sprays into both  nostrils as needed for allergies.     . furosemide (LASIX) 20 MG tablet Take 20 mg by mouth as needed for fluid.    Marland Kitchen ibuprofen (ADVIL) 200 MG tablet Take 400 mg by mouth every 6 (six) hours as needed for headache or moderate pain.    Marland Kitchen levothyroxine (SYNTHROID) 88 MCG tablet Take 88 mcg by mouth daily.     . Multiple Vitamins-Minerals (CENTRUM SILVER 50+WOMEN) TABS Take by mouth.    . Omega-3 Fatty Acids (FISH OIL) 1000 MG CAPS Take 1,000 mg by mouth daily.     Marland Kitchen omeprazole (PRILOSEC) 20 MG capsule TAKE (1) CAPSULE BY MOUTH ONCE DAILY. 90 capsule 3  . TURMERIC PO Take 550 mg by mouth daily.    . vitamin E (VITAMIN E) 400 UNIT capsule Take 400  Units by mouth daily.      Social History   Socioeconomic History  . Marital status: Married    Spouse name: Not on file  . Number of children: Not on file  . Years of education: Not on file  . Highest education level: Not on file  Occupational History  . Not on file  Tobacco Use  . Smoking status: Never Smoker  . Smokeless tobacco: Never Used  Substance and Sexual Activity  . Alcohol use: No    Alcohol/week: 0.0 standard drinks  . Drug use: No  . Sexual activity: Not on file  Other Topics Concern  . Not on file  Social History Narrative   Lives in [place] with spouse.   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   Social Determinants of Health   Financial Resource Strain:   . Difficulty of Paying Living Expenses: Not on file  Food Insecurity:   . Worried About Charity fundraiser in the Last Year: Not on file  . Ran Out of Food in the Last Year: Not on file  Transportation Needs:   . Lack of Transportation (Medical): Not on file  . Lack of Transportation (Non-Medical): Not on file  Physical Activity:   . Days of Exercise per Week: Not on file  . Minutes of Exercise per Session: Not on file  Stress:   . Feeling of Stress : Not on file  Social Connections:   . Frequency of Communication with Friends and Family: Not on file  . Frequency of Social Gatherings with Friends and Family: Not on file  . Attends Religious Services: Not on file  . Active Member of Clubs or Organizations: Not on file  . Attends Archivist Meetings: Not on file  . Marital Status: Not on file  Intimate Partner Violence:   . Fear of Current or Ex-Partner: Not on file  . Emotionally Abused: Not on file  . Physically Abused: Not on file  . Sexually Abused: Not on file   Family History  Problem Relation Age of Onset  . Thyroid disease Mother   . Atrial fibrillation Mother   . Fibromyalgia Mother   . Arthritis Mother   . Emphysema Father   .  Diabetes Father   . Heart disease Father   . Cancer Father        gall bladder    OBJECTIVE:  Vitals:   03/23/19 1423  BP: (!) 141/83  Pulse: 81  Resp: 20  Temp: 98.2 F (36.8 C)  SpO2: 97%     General appearance: alert; appears mildly fatigued, but nontoxic; speaking in full sentences and  tolerating own secretions HEENT: NCAT; Ears: EACs clear, TMs pearly gray; Eyes: PERRL.  EOM grossly intact. Sinuses: TTP over ethmoid and maxillary sinuses; Nose: nares patent without rhinorrhea, Throat: oropharynx clear, tonsils non erythematous or enlarged, uvula midline  Neck: supple without LAD Lungs: unlabored respirations, symmetrical air entry; cough: absent; no respiratory distress; CTAB Heart: regular rate and rhythm.  Skin: warm and dry Psychological: alert and cooperative; normal mood and affect  ASSESSMENT & PLAN:  1. Suspected COVID-19 virus infection   2. Acute non-recurrent pansinusitis     Meds ordered this encounter  Medications  . DISCONTD: amoxicillin-clavulanate (AUGMENTIN) 875-125 MG tablet    Sig: Take 1 tablet by mouth every 12 (twelve) hours for 10 days.    Dispense:  20 tablet    Refill:  0    Order Specific Question:   Supervising Provider    Answer:   Raylene Everts [id number]  . doxycycline (VIBRAMYCIN) 100 MG capsule    Sig: Take 1 capsule (100 mg total) by mouth 2 (two) times daily.    Dispense:  20 capsule    Refill:  0    Order Specific Question:   Supervising Provider    Answer:   Raylene Everts [id number]   COVID testing ordered.  It will take between 5-7 days for test results.  Someone will contact you regarding abnormal results.    In the meantime: You should remain isolated in your home for 10 days from symptom onset AND greater than 72 hours after symptoms resolution (absence of fever without the use of fever-reducing medication and improvement in respiratory symptoms), whichever is longer Get plenty of rest and push fluids Doxycycline  for sinus infection; cannot tolerate augmentin due to nausea Use OTC zyrtec for nasal congestion, runny nose, and/or sore throat Use OTC flonase for nasal congestion and runny nose Use medications daily for symptom relief Use OTC medications like ibuprofen or tylenol as needed fever or pain Call or go to the ED if you have any new or worsening symptoms such as fever, worsening cough, shortness of breath, chest tightness, chest pain, turning blue, changes in mental status, etc...    Reviewed expectations re: course of current medical issues. Questions answered. Outlined signs and symptoms indicating need for more acute intervention. Patient verbalized understanding. After Visit Summary given.         Lestine Box, PA-C 03/23/19 1506

## 2019-03-24 LAB — NOVEL CORONAVIRUS, NAA: SARS-CoV-2, NAA: NOT DETECTED

## 2019-03-27 ENCOUNTER — Ambulatory Visit (INDEPENDENT_AMBULATORY_CARE_PROVIDER_SITE_OTHER): Payer: PPO | Admitting: *Deleted

## 2019-03-27 ENCOUNTER — Telehealth (INDEPENDENT_AMBULATORY_CARE_PROVIDER_SITE_OTHER): Payer: PPO | Admitting: Internal Medicine

## 2019-03-27 ENCOUNTER — Encounter: Payer: Self-pay | Admitting: Internal Medicine

## 2019-03-27 ENCOUNTER — Telehealth: Payer: Self-pay

## 2019-03-27 VITALS — Ht 62.0 in | Wt 155.0 lb

## 2019-03-27 DIAGNOSIS — I480 Paroxysmal atrial fibrillation: Secondary | ICD-10-CM

## 2019-03-27 DIAGNOSIS — I10 Essential (primary) hypertension: Secondary | ICD-10-CM | POA: Diagnosis not present

## 2019-03-27 LAB — CUP PACEART REMOTE DEVICE CHECK
Date Time Interrogation Session: 20210104000706
Implantable Pulse Generator Implant Date: 20200928

## 2019-03-27 NOTE — Telephone Encounter (Signed)
I spoke with the pt and let her know we did receive her transmission. I told her she do not have to do a manual send unless we call and ask for it. The pt verbalized understanding and thanked me for the call.

## 2019-03-27 NOTE — Progress Notes (Signed)
Electrophysiology TeleHealth Note   Due to national recommendations of social distancing due to COVID 19, an audio/video telehealth visit is felt to be most appropriate for this patient at this time.  See MyChart message from today for the patient's consent to telehealth for [facility].   Date:  03/27/2019   ID:  Paige Jones, DOB December 16, 1950, MRN [id number]  Location: patient's home  Provider location:  [facility]  Evaluation Performed: Follow-up visit  PCP:  Chesley Noon, MD   Electrophysiologist:  Dr [name]  Chief Complaint:  palpitations  History of Present Illness:    Paige Jones is a 69 y.o. female who presents via telehealth conferencing today.  Since last being seen in our clinic, the patient reports doing very well.  She reports having vagal syncope 03/07/2019 in the setting of severe abdominal cramping.  She was evaluated in the ER without abnormal findings.  Today, she denies symptoms of palpitations, chest pain, shortness of breath,  lower extremity edema, dizziness, or further syncope.  The patient is otherwise without complaint today.  The patient denies symptoms of fevers, chills, cough, or new SOB worrisome for COVID 19.  Past Medical History:  Diagnosis Date  . Arachnoid cyst   . Arthritis   . Basal cell carcinoma   . Hyperlipidemia   . Hypertension   . Paroxysmal atrial fibrillation (HCC)   . Thyroid goiter     Past Surgical History:  Procedure Laterality Date  . ATRIAL FIBRILLATION ABLATION N/A 08/30/2018   Procedure: ATRIAL FIBRILLATION ABLATION;  Surgeon: Thompson Grayer, MD;  Location: [facility];  Service: Cardiovascular;  Laterality: N/A;  . BASAL CELL CARCINOMA EXCISION    . HYSTERECTOMY ABDOMINAL WITH SALPINGECTOMY    . implantable loop recorder implantation  12/19/2018    Medtronic Reveal [place] model [id number] (SN [id number])  implantable loop recorder implanted by Dr [name] in office for afib management post ablation  .  TONSILLECTOMY AND ADENOIDECTOMY      Current Outpatient Medications  Medication Sig Dispense Refill  . atorvastatin (LIPITOR) 20 MG tablet TAKE ONE TABLET BY MOUTH ONCE DAILY. 30 tablet 6  . azelastine (ASTELIN) 0.1 % nasal spray Place 1 spray into the nose 2 (two) times daily as needed for allergies.     . calcium carbonate (OS-CAL) 600 MG TABS tablet Take 600 mg by mouth daily.     . Carboxymethylcellul-Glycerin (LUBRICATING EYE DROPS OP) Place 1 drop into both eyes daily as needed (dry eyes).    . cetirizine (ZYRTEC) 10 MG tablet Take 10 mg by mouth at bedtime as needed for allergies.    Marland Kitchen diclofenac (VOLTAREN) 75 MG EC tablet TAKE (1) TABLET BY MOUTH TWICE DAILY. 180 tablet 0  . diclofenac sodium (VOLTAREN) 1 % GEL Apply 4 g topically 4 (four) times daily. 100 g 3  . doxycycline (VIBRAMYCIN) 100 MG capsule Take 1 capsule (100 mg total) by mouth 2 (two) times daily. 20 capsule 0  . estradiol (ESTRACE) 0.1 MG/GM vaginal cream Place 1 Applicatorful vaginally as directed.    Marland Kitchen estradiol (VIVELLE-DOT) 0.025 MG/24HR Place 1 patch onto the skin 2 (two) times a week.    . fenofibrate 160 MG tablet TAKE ONE TABLET BY MOUTH ONCE DAILY. 90 tablet 0  . fluticasone (FLONASE) 50 MCG/ACT nasal spray Place 2 sprays into both nostrils as needed for allergies.     . furosemide (LASIX) 20 MG tablet Take 20 mg by mouth as needed for fluid.    Marland Kitchen  ibuprofen (ADVIL) 200 MG tablet Take 400 mg by mouth every 6 (six) hours as needed for headache or moderate pain.    Marland Kitchen levothyroxine (SYNTHROID) 88 MCG tablet Take 88 mcg by mouth daily.     . Multiple Vitamins-Minerals (CENTRUM SILVER 50+WOMEN) TABS Take by mouth.    . Omega-3 Fatty Acids (FISH OIL) 1000 MG CAPS Take 1,000 mg by mouth daily.     Marland Kitchen omeprazole (PRILOSEC) 20 MG capsule TAKE (1) CAPSULE BY MOUTH ONCE DAILY. 90 capsule 3  . TURMERIC PO Take 550 mg by mouth daily.    . vitamin E (VITAMIN E) 400 UNIT capsule Take 400 Units by mouth daily.      No current  facility-administered medications for this visit.    Allergies:   Melatonin, Pseudoephedrine, Rosuvastatin, Benadryl [diphenhydramine], and Prednisone   Social History:  The patient  reports that she has never smoked. She has never used smokeless tobacco. She reports that she does not drink alcohol or use drugs.   Family History:  The patient's family history includes Arthritis in her mother; Atrial fibrillation in her mother; Cancer in her father; Diabetes in her father; Emphysema in her father; Fibromyalgia in her mother; Heart disease in her father; Thyroid disease in her mother.   ROS:  Please see the history of present illness.   All other systems are personally reviewed and negative.    Exam:    Vital Signs:  Ht 5\' 2"  (1.575 m)   Wt 155 lb (70.3 kg)   BMI 28.35 kg/m   Well sounding and appearing, alert and conversant, regular work of breathing,  good skin color Eyes- anicteric, neuro- grossly intact, skin- no apparent rash or lesions or cyanosis, mouth- oral mucosa is pink  Labs/Other Tests and Data Reviewed:    Recent Labs: 03/07/2019: BUN 15; Creatinine, Ser 0.68; Hemoglobin 13.0; Platelets 262; Potassium 4.1; Sodium 141   Wt Readings from Last 3 Encounters:  03/27/19 155 lb (70.3 kg)  12/19/18 158 lb (71.7 kg)  09/26/18 158 lb (71.7 kg)     Last device remote is reviewed from [facility] PDF which reveals normal device function, no arrhythmias    ASSESSMENT & PLAN:    1.  Paroxysmal atrial fibrillation No afib by ILR interrogaiton Remotes are up to date She is off eliquis despite chads2vasc score of 3.  May consider additional Fowler if her afib returns  2. HTN Stable No change required today  3. Vasovagal syncope Unlikely to return She has ILR in place.  If further syncope, we will adjust brady and pause detections on the device.   Follow-up:  6 months with me Continue to follow remotely in the interim   Patient Risk:  after full review of this patients  clinical status, I feel that they are at moderate risk at this time.  Today, I have spent 15 minutes with the patient with telehealth technology discussing arrhythmia management .    SignedThompson Grayer, MD  03/27/2019 2:28 PM     [address] [place] [address] [phone] (office) [phone] (fax)

## 2019-04-21 DIAGNOSIS — C4441 Basal cell carcinoma of skin of scalp and neck: Secondary | ICD-10-CM | POA: Diagnosis not present

## 2019-04-21 DIAGNOSIS — C44519 Basal cell carcinoma of skin of other part of trunk: Secondary | ICD-10-CM | POA: Diagnosis not present

## 2019-04-27 ENCOUNTER — Ambulatory Visit (INDEPENDENT_AMBULATORY_CARE_PROVIDER_SITE_OTHER): Payer: PPO | Admitting: *Deleted

## 2019-04-27 DIAGNOSIS — I480 Paroxysmal atrial fibrillation: Secondary | ICD-10-CM

## 2019-04-27 LAB — CUP PACEART REMOTE DEVICE CHECK
Date Time Interrogation Session: 20210204002243
Implantable Pulse Generator Implant Date: 20200927200000

## 2019-04-27 NOTE — Progress Notes (Signed)
ILR Remote 

## 2019-05-19 DIAGNOSIS — L905 Scar conditions and fibrosis of skin: Secondary | ICD-10-CM | POA: Diagnosis not present

## 2019-05-19 DIAGNOSIS — C44519 Basal cell carcinoma of skin of other part of trunk: Secondary | ICD-10-CM | POA: Diagnosis not present

## 2019-05-29 ENCOUNTER — Ambulatory Visit (INDEPENDENT_AMBULATORY_CARE_PROVIDER_SITE_OTHER): Payer: PPO | Admitting: *Deleted

## 2019-05-29 DIAGNOSIS — I480 Paroxysmal atrial fibrillation: Secondary | ICD-10-CM | POA: Diagnosis not present

## 2019-05-29 LAB — CUP PACEART REMOTE DEVICE CHECK
Date Time Interrogation Session: 20210308032658
Implantable Pulse Generator Implant Date: 20200928

## 2019-05-30 NOTE — Progress Notes (Signed)
ILR Remote 

## 2019-06-05 DIAGNOSIS — C44319 Basal cell carcinoma of skin of other parts of face: Secondary | ICD-10-CM | POA: Diagnosis not present

## 2019-06-19 DIAGNOSIS — C4441 Basal cell carcinoma of skin of scalp and neck: Secondary | ICD-10-CM | POA: Diagnosis not present

## 2019-06-19 DIAGNOSIS — L814 Other melanin hyperpigmentation: Secondary | ICD-10-CM | POA: Diagnosis not present

## 2019-06-19 DIAGNOSIS — D225 Melanocytic nevi of trunk: Secondary | ICD-10-CM | POA: Diagnosis not present

## 2019-06-19 DIAGNOSIS — D485 Neoplasm of uncertain behavior of skin: Secondary | ICD-10-CM | POA: Diagnosis not present

## 2019-06-19 DIAGNOSIS — D229 Melanocytic nevi, unspecified: Secondary | ICD-10-CM | POA: Diagnosis not present

## 2019-06-19 DIAGNOSIS — Z85828 Personal history of other malignant neoplasm of skin: Secondary | ICD-10-CM | POA: Diagnosis not present

## 2019-06-19 DIAGNOSIS — L821 Other seborrheic keratosis: Secondary | ICD-10-CM | POA: Diagnosis not present

## 2019-06-19 DIAGNOSIS — L819 Disorder of pigmentation, unspecified: Secondary | ICD-10-CM | POA: Diagnosis not present

## 2019-06-19 DIAGNOSIS — C44719 Basal cell carcinoma of skin of left lower limb, including hip: Secondary | ICD-10-CM | POA: Diagnosis not present

## 2019-06-19 DIAGNOSIS — D1801 Hemangioma of skin and subcutaneous tissue: Secondary | ICD-10-CM | POA: Diagnosis not present

## 2019-06-19 DIAGNOSIS — L905 Scar conditions and fibrosis of skin: Secondary | ICD-10-CM | POA: Diagnosis not present

## 2019-06-19 DIAGNOSIS — L570 Actinic keratosis: Secondary | ICD-10-CM | POA: Diagnosis not present

## 2019-06-19 DIAGNOSIS — C44729 Squamous cell carcinoma of skin of left lower limb, including hip: Secondary | ICD-10-CM | POA: Diagnosis not present

## 2019-06-29 ENCOUNTER — Ambulatory Visit (INDEPENDENT_AMBULATORY_CARE_PROVIDER_SITE_OTHER): Payer: PPO | Admitting: *Deleted

## 2019-06-29 DIAGNOSIS — I480 Paroxysmal atrial fibrillation: Secondary | ICD-10-CM | POA: Diagnosis not present

## 2019-06-29 LAB — CUP PACEART REMOTE DEVICE CHECK
Date Time Interrogation Session: 20210408042931
Implantable Pulse Generator Implant Date: 20200928

## 2019-06-29 NOTE — Progress Notes (Signed)
ILR Remote 

## 2019-07-05 DIAGNOSIS — Z85828 Personal history of other malignant neoplasm of skin: Secondary | ICD-10-CM | POA: Diagnosis not present

## 2019-07-05 DIAGNOSIS — L905 Scar conditions and fibrosis of skin: Secondary | ICD-10-CM | POA: Diagnosis not present

## 2019-07-05 DIAGNOSIS — C44719 Basal cell carcinoma of skin of left lower limb, including hip: Secondary | ICD-10-CM | POA: Diagnosis not present

## 2019-07-05 DIAGNOSIS — D0472 Carcinoma in situ of skin of left lower limb, including hip: Secondary | ICD-10-CM | POA: Diagnosis not present

## 2019-07-06 ENCOUNTER — Encounter: Payer: Self-pay | Admitting: Plastic Surgery

## 2019-07-06 ENCOUNTER — Other Ambulatory Visit: Payer: Self-pay

## 2019-07-06 ENCOUNTER — Ambulatory Visit: Payer: PPO | Admitting: Plastic Surgery

## 2019-07-06 VITALS — BP 155/90 | HR 69 | Temp 96.9°F | Ht 62.0 in | Wt 158.4 lb

## 2019-07-06 DIAGNOSIS — L905 Scar conditions and fibrosis of skin: Secondary | ICD-10-CM

## 2019-07-06 NOTE — Progress Notes (Signed)
Referring Provider Chesley Noon, MD [place],  [place] [address]   CC:  Chief Complaint  Patient presents with  . Advice Only    for scarring on nose and for facial lines      Paige Jones is an 69 y.o. female.  HPI: Patient presents to discuss nasal contour irregularities after previous surgeries for nonmelanoma skin cancer.  She had a skin cancer removed from her right nasal tip some years ago that required a full-thickness skin graft for reconstruction.  She is bothered by the fact that this area is softer and slightly sunken and compared to the surrounding tissue.  She subsequently had an additional excision on the dorsal right nasal sidewall and is bothered by the subtle contour depression in that area as well.  She has had Juvderm injected into the nasal tip previously and felt like that smoothed out the contour for that area temporarily.  She was wondering if fat grafting could be done to have a more permanent solution.  She is also bothered by her marionette lines.  She has had these injected previously with filler and liked the results.  That was about 10 or 15 years ago she says.  She also wants to discuss smoothing out the pigment irregularities in her cheeks.  She has small veins and erythematous irregular pigmentation that bother her.  Allergies  Allergen Reactions  . Melatonin Itching    "Keeps me awake"  . Pseudoephedrine     Elevated BP  . Rosuvastatin     headache  . Benadryl [Diphenhydramine] Itching  . Prednisone     Went into afib    Outpatient Encounter Medications as of 07/06/2019  Medication Sig  . atorvastatin (LIPITOR) 20 MG tablet TAKE ONE TABLET BY MOUTH ONCE DAILY.  Marland Kitchen azelastine (ASTELIN) 0.1 % nasal spray Place 1 spray into the nose 2 (two) times daily as needed for allergies.   . calcium carbonate (OS-CAL) 600 MG TABS tablet Take 600 mg by mouth daily.   . cetirizine (ZYRTEC) 10 MG tablet Take 10 mg by mouth at bedtime as  needed for allergies.  Marland Kitchen diclofenac (VOLTAREN) 75 MG EC tablet TAKE (1) TABLET BY MOUTH TWICE DAILY.  Marland Kitchen diclofenac sodium (VOLTAREN) 1 % GEL Apply 4 g topically 4 (four) times daily.  Marland Kitchen estradiol (ESTRACE) 0.1 MG/GM vaginal cream Place 1 Applicatorful vaginally as directed.  Marland Kitchen estradiol (VIVELLE-DOT) 0.025 MG/24HR Place 1 patch onto the skin 2 (two) times a week.  . fenofibrate 160 MG tablet TAKE ONE TABLET BY MOUTH ONCE DAILY.  . fluticasone (FLONASE) 50 MCG/ACT nasal spray Place 2 sprays into both nostrils as needed for allergies.   . furosemide (LASIX) 20 MG tablet Take 20 mg by mouth as needed for fluid.  Marland Kitchen ibuprofen (ADVIL) 200 MG tablet Take 400 mg by mouth every 6 (six) hours as needed for headache or moderate pain.  Marland Kitchen levothyroxine (SYNTHROID) 88 MCG tablet Take 88 mcg by mouth daily.   . Omega-3 Fatty Acids (FISH OIL) 1000 MG CAPS Take 1,000 mg by mouth daily.   Marland Kitchen omeprazole (PRILOSEC) 20 MG capsule TAKE (1) CAPSULE BY MOUTH ONCE DAILY.  Marland Kitchen OVER THE COUNTER MEDICATION Vitamin D3-Take 1 gel cap daily.  Marland Kitchen OVER THE COUNTER MEDICATION Vitamin B-Take 1 capsule by mouth daily.  . TURMERIC PO Take 550 mg by mouth daily.  . vitamin E (VITAMIN E) 400 UNIT capsule Take 400 Units by mouth daily.   . Multiple  Vitamins-Minerals (CENTRUM SILVER 50+WOMEN) TABS Take by mouth.  . [DISCONTINUED] Carboxymethylcellul-Glycerin (LUBRICATING EYE DROPS OP) Place 1 drop into both eyes daily as needed (dry eyes).  . [DISCONTINUED] doxycycline (VIBRAMYCIN) 100 MG capsule Take 1 capsule (100 mg total) by mouth 2 (two) times daily.   No facility-administered encounter medications on file as of 07/06/2019.     Past Medical History:  Diagnosis Date  . Arachnoid cyst   . Arthritis   . Basal cell carcinoma   . Hyperlipidemia   . Hypertension   . Paroxysmal atrial fibrillation (HCC)   . Thyroid goiter     Past Surgical History:  Procedure Laterality Date  . ATRIAL FIBRILLATION ABLATION N/A 08/30/2018    Procedure: ATRIAL FIBRILLATION ABLATION;  Surgeon: Thompson Grayer, MD;  Location: [facility];  Service: Cardiovascular;  Laterality: N/A;  . BASAL CELL CARCINOMA EXCISION    . HYSTERECTOMY ABDOMINAL WITH SALPINGECTOMY    . implantable loop recorder implantation  12/19/2018    Medtronic Reveal [place] model [id number] (SN [id number])  implantable loop recorder implanted by Dr [name] in office for afib management post ablation  . TONSILLECTOMY AND ADENOIDECTOMY      Family History  Problem Relation Age of Onset  . Thyroid disease Mother   . Atrial fibrillation Mother   . Fibromyalgia Mother   . Arthritis Mother   . Emphysema Father   . Diabetes Father   . Heart disease Father   . Cancer Father        gall bladder    Social History   Social History Narrative   Lives in [place] with spouse.   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     Review of Systems General: Denies fevers, chills, weight loss CV: Denies chest pain, shortness of breath, palpitations  Physical Exam Vitals with BMI 07/06/2019 03/27/2019 03/23/2019  Height 5\' 2"  5\' 2"  -  Weight 158 lbs 6 oz 155 lbs -  BMI [id number] -  Systolic [id number] - [id number]  Diastolic 90 - 83  Pulse 69 - 81    General:  No acute distress,  Alert and oriented, Non-Toxic, Normal speech and affect HEENT: She has a subtle contour depression on the right dorsal nasal sidewall.  This is linear in nature and is about a centimeter to a centimeter and a half in length.  She has about a 1.5 cm diameter full-thickness skin graft on the right nasal tip that has quite good color match but the skin is slightly sunken compared to the surrounding tissue.  She does have prominent marionette lines and scattered erythematous skin pigmentation in her cheeks.  Assessment/Plan Patient presents with nasal contour irregularities after reconstruction for skin cancer excisions.  I discussed a couple of different options regarding her  issue with her nose.  I think she would be a good candidate for a small amount of fat grafting that would hopefully be more durable than filler.  I explained I would aspirate the fat from her abdomen and process it and then injected into the affected areas.  I could also put some along the marionette lines that would hopefully fill that area out as well.  She likes this idea and will try and arrange for insurance coverage.  I discussed the risk of the procedures include bleeding, infection, demonstrating structures, need for additional procedures.  We discussed swelling and bruising at the Lipo suction site.  We discussed the potential for contour irregularities after  the fat grafting has been performed.  Regarding the facial pigmentation I recommended laser treatment and she is good to think about that.  Cindra Presume 07/06/2019, 4:23 PM

## 2019-07-13 DIAGNOSIS — I10 Essential (primary) hypertension: Secondary | ICD-10-CM | POA: Diagnosis not present

## 2019-07-13 DIAGNOSIS — E039 Hypothyroidism, unspecified: Secondary | ICD-10-CM | POA: Diagnosis not present

## 2019-07-20 ENCOUNTER — Other Ambulatory Visit (HOSPITAL_COMMUNITY): Payer: Self-pay | Admitting: Endocrinology

## 2019-07-20 ENCOUNTER — Other Ambulatory Visit: Payer: Self-pay | Admitting: Endocrinology

## 2019-07-20 DIAGNOSIS — E039 Hypothyroidism, unspecified: Secondary | ICD-10-CM | POA: Diagnosis not present

## 2019-07-20 DIAGNOSIS — E049 Nontoxic goiter, unspecified: Secondary | ICD-10-CM

## 2019-07-20 DIAGNOSIS — I10 Essential (primary) hypertension: Secondary | ICD-10-CM | POA: Diagnosis not present

## 2019-07-21 ENCOUNTER — Telehealth: Payer: Self-pay | Admitting: Plastic Surgery

## 2019-07-21 NOTE — Telephone Encounter (Signed)
Patient lvm asking for status update on whether insurance would cover sx discussed with Dr. Claudia Desanctis. Returned call and spoke with husband. Advised that insurance has not responded at this time. He said he would let her know.

## 2019-07-28 ENCOUNTER — Ambulatory Visit (HOSPITAL_COMMUNITY)
Admission: RE | Admit: 2019-07-28 | Discharge: 2019-07-28 | Disposition: A | Discharge status: Home / Self Care | Payer: PPO | Source: Ambulatory Visit | Attending: Endocrinology | Admitting: Endocrinology

## 2019-07-28 ENCOUNTER — Other Ambulatory Visit: Payer: Self-pay

## 2019-07-28 DIAGNOSIS — E042 Nontoxic multinodular goiter: Secondary | ICD-10-CM | POA: Diagnosis not present

## 2019-07-28 DIAGNOSIS — E049 Nontoxic goiter, unspecified: Secondary | ICD-10-CM | POA: Diagnosis not present

## 2019-07-30 LAB — CUP PACEART REMOTE DEVICE CHECK
Date Time Interrogation Session: 20210509043355
Implantable Pulse Generator Implant Date: 20200928

## 2019-08-01 ENCOUNTER — Ambulatory Visit (INDEPENDENT_AMBULATORY_CARE_PROVIDER_SITE_OTHER): Payer: PPO | Admitting: *Deleted

## 2019-08-01 DIAGNOSIS — I480 Paroxysmal atrial fibrillation: Secondary | ICD-10-CM

## 2019-08-01 NOTE — Progress Notes (Addendum)
ICD-10-CM   1. Scar of nose  L90.5       Patient ID: Paige Jones, female    DOB: 06-05-1950, 69 y.o.   MRN: [id number]   History of Present Illness: Paige Jones is a 69 y.o.  female  with a history of nasal contour irregularities.  She presents for preoperative evaluation for upcoming procedure, Fat grafting to nose and marionette lines, scheduled for 08/29/19 with Dr. Claudia Desanctis.  Summary from previous visit: Patient had skin cancer removed from her right nasal tip requiring a full-thickness skin graft for reconstruction a few years ago resulting in a slightly sunken and softer appearance compared to the surrounding tissue. She also had excision on the dorsal right nasal sidewall resulting in a subtle contour depression. In addition she is bothered by her marionette lines.  She has had filler injected in all these areas and liked the results, but they were only temporary.  PMH Significant for: hypothyroid, paroxysmal Afib - underwent ablation to correct - has a recorder implanted, HLD, HTN, Basal Cell Carcinoma  Patient takes multiple OTC vitamins and herbal supplements. Spoke with patient about stopping these a few days prior to surgery to reduce bleeding risk.   The patient has not had problems with anesthesia.   Past Medical History: Allergies: Allergies  Allergen Reactions  . Melatonin Itching    "Keeps me awake"  . Pseudoephedrine     Elevated BP  . Rosuvastatin     headache  . Benadryl [Diphenhydramine] Itching  . Prednisone     Went into afib    Current Medications:  Current Outpatient Medications:  .  atorvastatin (LIPITOR) 20 MG tablet, TAKE ONE TABLET BY MOUTH ONCE DAILY., Disp: 30 tablet, Rfl: 6 .  azelastine (ASTELIN) 0.1 % nasal spray, Place 1 spray into the nose 2 (two) times daily as needed for allergies. , Disp: , Rfl:  .  calcium carbonate (OS-CAL) 600 MG TABS tablet, Take 600 mg by mouth daily. , Disp: , Rfl:  .  cetirizine (ZYRTEC) 10 MG tablet, Take 10  mg by mouth at bedtime as needed for allergies., Disp: , Rfl:  .  diclofenac (VOLTAREN) 75 MG EC tablet, TAKE (1) TABLET BY MOUTH TWICE DAILY., Disp: 180 tablet, Rfl: 0 .  diclofenac sodium (VOLTAREN) 1 % GEL, Apply 4 g topically 4 (four) times daily., Disp: 100 g, Rfl: 3 .  estradiol (ESTRACE) 0.1 MG/GM vaginal cream, Place 1 Applicatorful vaginally as directed., Disp: , Rfl:  .  estradiol (VIVELLE-DOT) 0.025 MG/24HR, Place 1 patch onto the skin 2 (two) times a week., Disp: , Rfl:  .  fenofibrate 160 MG tablet, TAKE ONE TABLET BY MOUTH ONCE DAILY., Disp: 90 tablet, Rfl: 0 .  fluticasone (FLONASE) 50 MCG/ACT nasal spray, Place 2 sprays into both nostrils as needed for allergies. , Disp: , Rfl:  .  furosemide (LASIX) 20 MG tablet, Take 20 mg by mouth as needed for fluid., Disp: , Rfl:  .  ibuprofen (ADVIL) 200 MG tablet, Take 400 mg by mouth every 6 (six) hours as needed for headache or moderate pain., Disp: , Rfl:  .  levothyroxine (SYNTHROID) 88 MCG tablet, Take 88 mcg by mouth daily. , Disp: , Rfl:  .  Multiple Vitamins-Minerals (CENTRUM SILVER 50+WOMEN) TABS, Take by mouth., Disp: , Rfl:  .  Omega-3 Fatty Acids (FISH OIL) 1000 MG CAPS, Take 1,000 mg by mouth daily. , Disp: , Rfl:  .  omeprazole (PRILOSEC) 20 MG capsule,  TAKE (1) CAPSULE BY MOUTH ONCE DAILY., Disp: 90 capsule, Rfl: 3 .  TURMERIC PO, Take 550 mg by mouth daily., Disp: , Rfl:  .  vitamin E (VITAMIN E) 400 UNIT capsule, Take 400 Units by mouth daily. , Disp: , Rfl:   Past Medical Problems: Past Medical History:  Diagnosis Date  . Arachnoid cyst   . Arthritis   . Basal cell carcinoma   . Hyperlipidemia   . Hypertension   . Paroxysmal atrial fibrillation (HCC)   . Thyroid goiter     Past Surgical History: Past Surgical History:  Procedure Laterality Date  . ATRIAL FIBRILLATION ABLATION N/A 08/30/2018   Procedure: ATRIAL FIBRILLATION ABLATION;  Surgeon: Thompson Grayer, MD;  Location: [facility];  Service:  Cardiovascular;  Laterality: N/A;  . BASAL CELL CARCINOMA EXCISION    . HYSTERECTOMY ABDOMINAL WITH SALPINGECTOMY    . implantable loop recorder implantation  12/19/2018    Medtronic Reveal [place] model [id number] (SN [id number])  implantable loop recorder implanted by Dr [name] in office for afib management post ablation  . TONSILLECTOMY AND ADENOIDECTOMY      Social History: Social History   Socioeconomic History  . Marital status: Married    Spouse name: Not on file  . Number of children: Not on file  . Years of education: Not on file  . Highest education level: Not on file  Occupational History  . Not on file  Tobacco Use  . Smoking status: Never Smoker  . Smokeless tobacco: Never Used  Substance and Sexual Activity  . Alcohol use: No    Alcohol/week: 0.0 standard drinks  . Drug use: No  . Sexual activity: Not on file  Other Topics Concern  . Not on file  Social History Narrative   Lives in [place] with spouse.   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   Social Determinants of Health   Financial Resource Strain:   . Difficulty of Paying Living Expenses:   Food Insecurity:   . Worried About Charity fundraiser in the Last Year:   . Arboriculturist in the Last Year:   Transportation Needs:   . Film/video editor (Medical):   Marland Kitchen Lack of Transportation (Non-Medical):   Physical Activity:   . Days of Exercise per Week:   . Minutes of Exercise per Session:   Stress:   . Feeling of Stress :   Social Connections:   . Frequency of Communication with Friends and Family:   . Frequency of Social Gatherings with Friends and Family:   . Attends Religious Services:   . Active Member of Clubs or Organizations:   . Attends Archivist Meetings:   Marland Kitchen Marital Status:   Intimate Partner Violence:   . Fear of Current or Ex-Partner:   . Emotionally Abused:   Marland Kitchen Physically Abused:   . Sexually Abused:     Family History: Family  History  Problem Relation Age of Onset  . Thyroid disease Mother   . Atrial fibrillation Mother   . Fibromyalgia Mother   . Arthritis Mother   . Emphysema Father   . Diabetes Father   . Heart disease Father   . Cancer Father        gall bladder    Review of Systems: Review of Systems  Constitutional: Negative for chills and fever.  HENT: Negative for congestion and sore throat.        Scar  on nose and pronounced marionette lines.  Respiratory: Negative for cough and shortness of breath.   Cardiovascular: Negative for chest pain and palpitations.  Gastrointestinal: Negative for abdominal pain, nausea and vomiting.  Musculoskeletal: Negative for back pain, joint pain, myalgias and neck pain.  Skin: Negative for itching and rash.    Physical Exam: Vital Signs BP 138/79 (BP Location: Left Arm, Patient Position: Sitting, Cuff Size: Normal)   Pulse 74   Temp 97.7 F (36.5 C) (Temporal)   Ht 5\' 2"  (1.575 m)   Wt 155 lb (70.3 kg)   SpO2 98%   BMI 28.35 kg/m  Physical Exam Constitutional:      Appearance: Normal appearance. She is normal weight.  HENT:     Head: Normocephalic and atraumatic.      Comments: pronounced marionette lines Eyes:     Extraocular Movements: Extraocular movements intact.  Cardiovascular:     Rate and Rhythm: Normal rate and regular rhythm.     Pulses: Normal pulses.     Heart sounds: Normal heart sounds.  Pulmonary:     Effort: Pulmonary effort is normal.     Breath sounds: Normal breath sounds. No wheezing, rhonchi or rales.  Abdominal:     General: Bowel sounds are normal.     Palpations: Abdomen is soft.  Musculoskeletal:        General: No swelling. Normal range of motion.     Cervical back: Normal range of motion.  Skin:    General: Skin is warm and dry.     Coloration: Skin is not pale.     Findings: No erythema or rash.  Neurological:     General: No focal deficit present.     Mental Status: She is alert and oriented to person,  place, and time.  Psychiatric:        Mood and Affect: Mood normal.        Behavior: Behavior normal.        Thought Content: Thought content normal.        Judgment: Judgment normal.     Assessment/Plan:  Ms. Raffo scheduled for Fat Grafting to nose and marionette lines with Dr. Claudia Desanctis.  Risks, benefits, and alternatives of procedure discussed, questions answered and consent obtained.    Smoking Status: non-smoker  Caprini Score: 6 High; Risk Factors include: 69 yr old female, HRT, BMI > 25, and length of planned surgery. Recommendation for mechanical and pharmacological prophylaxis during surgery. Encourage early ambulation.   Pictures obtained: 07/06/19   Post-op Rx sent to pharmacy: Tramadol, zofran  Patient was provided with general surgical risk ** prior to their appointment.  They had adequate time to read through the consent forms and we also discussed them in person together during this preop appointment.  All of their questions were answered to their content.  Recommended calling if they have any further questions.  Consent form to be scanned into patient's chart.  The [place] was signed into law in 2016 which includes the topic of electronic health records.  This provides immediate access to information in MyChart.  This includes consultation notes, operative notes, office notes, lab results and pathology reports.  If you have any questions about what you read please let us know at your next visit or call us at the office.  We are right here with you.   Electronically signed by: Threasa Heads, PA-C 08/03/2019 2:36 PM

## 2019-08-03 ENCOUNTER — Encounter: Payer: Self-pay | Admitting: Plastic Surgery

## 2019-08-03 ENCOUNTER — Other Ambulatory Visit: Payer: Self-pay

## 2019-08-03 ENCOUNTER — Ambulatory Visit (INDEPENDENT_AMBULATORY_CARE_PROVIDER_SITE_OTHER): Payer: PPO | Admitting: Plastic Surgery

## 2019-08-03 DIAGNOSIS — L905 Scar conditions and fibrosis of skin: Secondary | ICD-10-CM

## 2019-08-03 MED ORDER — TRAMADOL HCL 50 MG PO TABS: 50.0000 mg | ORAL_TABLET | Freq: Three times a day (TID) | ORAL | 0 refills | Status: AC | PRN

## 2019-08-03 MED ORDER — ONDANSETRON HCL 4 MG PO TABS: 4.0000 mg | ORAL_TABLET | Freq: Three times a day (TID) | ORAL | 0 refills | Status: DC | PRN

## 2019-08-03 NOTE — Progress Notes (Signed)
Carelink Summary Report / Loop Recorder 

## 2019-08-25 DIAGNOSIS — Z01818 Encounter for other preprocedural examination: Secondary | ICD-10-CM | POA: Diagnosis not present

## 2019-08-25 DIAGNOSIS — Z719 Counseling, unspecified: Secondary | ICD-10-CM

## 2019-08-29 DIAGNOSIS — L988 Other specified disorders of the skin and subcutaneous tissue: Secondary | ICD-10-CM | POA: Diagnosis not present

## 2019-08-29 DIAGNOSIS — Z411 Encounter for cosmetic surgery: Secondary | ICD-10-CM | POA: Diagnosis not present

## 2019-08-29 DIAGNOSIS — L905 Scar conditions and fibrosis of skin: Secondary | ICD-10-CM | POA: Diagnosis not present

## 2019-08-30 ENCOUNTER — Other Ambulatory Visit: Payer: Self-pay | Admitting: Internal Medicine

## 2019-09-04 ENCOUNTER — Ambulatory Visit (INDEPENDENT_AMBULATORY_CARE_PROVIDER_SITE_OTHER): Payer: PPO | Admitting: *Deleted

## 2019-09-04 DIAGNOSIS — I480 Paroxysmal atrial fibrillation: Secondary | ICD-10-CM

## 2019-09-04 LAB — CUP PACEART REMOTE DEVICE CHECK
Date Time Interrogation Session: 20210614001717
Implantable Pulse Generator Implant Date: 20200928

## 2019-09-06 ENCOUNTER — Encounter: Payer: Self-pay | Admitting: Plastic Surgery

## 2019-09-06 ENCOUNTER — Ambulatory Visit (INDEPENDENT_AMBULATORY_CARE_PROVIDER_SITE_OTHER): Payer: PPO | Admitting: Plastic Surgery

## 2019-09-06 ENCOUNTER — Other Ambulatory Visit: Payer: Self-pay

## 2019-09-06 VITALS — BP 120/79 | HR 74 | Temp 97.1°F

## 2019-09-06 DIAGNOSIS — L905 Scar conditions and fibrosis of skin: Secondary | ICD-10-CM

## 2019-09-06 NOTE — Progress Notes (Signed)
Carelink Summary Report / Loop Recorder 

## 2019-09-06 NOTE — Progress Notes (Signed)
Patient is here 1 week postop from fat grafting to the nose and marionette lines.  She had a few irregular scars on the nose and was hoping for them to be smoothed out.  She feels like both areas have been improved quite a bit by the fat grafting and she is quite happy with the marionette lines.  She is still little bit bothered by the remaining irregularities in the contour of the nasal skin as a result of the scars.  On examination I see exactly what she does and that both areas are improved but there are still some contour irregularities in the scarring particularly on the bridge of her nose that can be noticeable if he catches the light the wrong way.  We will plan to let this continue to settle out and I do think she will get some additional improvement in the nose this time goes by as the swelling goes down from the trauma of the surgery.  We will make her an appointment for skin care products as she has a few other areas of concern on her cheeks in terms of pigment and telangiectasias.  I will plan to see her again in 4 to 6 weeks and we can discuss either microneedling or dermabrasion resurfacing on the dorsum of the nose to try and smooth those areas out.

## 2019-09-27 ENCOUNTER — Encounter: Payer: Self-pay | Admitting: Internal Medicine

## 2019-09-27 ENCOUNTER — Ambulatory Visit: Payer: PPO | Admitting: Internal Medicine

## 2019-09-27 ENCOUNTER — Other Ambulatory Visit: Payer: Self-pay

## 2019-09-27 VITALS — BP 128/72 | HR 74 | Ht 62.0 in | Wt 154.0 lb

## 2019-09-27 DIAGNOSIS — R55 Syncope and collapse: Secondary | ICD-10-CM | POA: Insufficient documentation

## 2019-09-27 DIAGNOSIS — I10 Essential (primary) hypertension: Secondary | ICD-10-CM

## 2019-09-27 DIAGNOSIS — I480 Paroxysmal atrial fibrillation: Secondary | ICD-10-CM

## 2019-09-27 LAB — CUP PACEART INCLINIC DEVICE CHECK
Date Time Interrogation Session: 20210707170445
Implantable Pulse Generator Implant Date: 20200928

## 2019-09-27 NOTE — Patient Instructions (Signed)
Medication Instructions:  Your physician recommends that you continue on your current medications as directed. Please refer to the Current Medication list given to you today.  *If you need a refill on your cardiac medications before your next appointment, please call your pharmacy*  Lab Work: None ordered.  If you have labs (blood work) drawn today and your tests are completely normal, you will receive your results only by: . MyChart Message (if you have MyChart) OR . A paper copy in the mail If you have any lab test that is abnormal or we need to change your treatment, we will call you to review the results.  Testing/Procedures: None ordered.  Follow-Up: At [facility], you and your health needs are our priority.  As part of our continuing mission to provide you with exceptional heart care, we have created designated Provider Care Teams.  These Care Teams include your primary Cardiologist (physician) and Advanced Practice Providers (APPs -  Physician Assistants and Nurse Practitioners) who all work together to provide you with the care you need, when you need it.  We recommend signing up for the patient portal called "MyChart".  Sign up information is provided on this After Visit Summary.  MyChart is used to connect with patients for Virtual Visits (Telemedicine).  Patients are able to view lab/test results, encounter notes, upcoming appointments, etc.  Non-urgent messages can be sent to your provider as well.   To learn more about what you can do with MyChart, go to [email or web address].    Your next appointment:   Your physician wants you to follow-up in: 1 year with Dr. Allred. You will receive a reminder letter in the mail two months in advance. If you don't receive a letter, please call our office to schedule the follow-up appointment.   Other Instructions:  

## 2019-09-27 NOTE — Progress Notes (Signed)
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 female who presents today for routine electrophysiology followup.  Since last being seen in our clinic, the patient reports doing very well.  Today, she denies symptoms of palpitations, chest pain, shortness of breath,  lower extremity edema, dizziness, presyncope, or syncope.  The patient is otherwise without complaint today.   Past Medical History:  Diagnosis Date   Arachnoid cyst    Arthritis    Basal cell carcinoma    Hyperlipidemia    Hypertension    Paroxysmal atrial fibrillation (HCC)    Thyroid goiter    Past Surgical History:  Procedure Laterality Date   ATRIAL FIBRILLATION ABLATION N/A 08/30/2018   Procedure: ATRIAL FIBRILLATION ABLATION;  Surgeon: Thompson Grayer, MD;  Location: [facility];  Service: Cardiovascular;  Laterality: N/A;   BASAL CELL CARCINOMA EXCISION     HYSTERECTOMY ABDOMINAL WITH SALPINGECTOMY     implantable loop recorder implantation  12/19/2018    Medtronic Reveal [place] model LNQ11 (SN [id number])  implantable loop recorder implanted by Dr [name] in office for afib management post ablation   TONSILLECTOMY AND ADENOIDECTOMY      ROS- all systems are reviewed and negatives except as per HPI above  Current Outpatient Medications  Medication Sig Dispense Refill   atorvastatin (LIPITOR) 20 MG tablet TAKE ONE TABLET BY MOUTH ONCE DAILY. 30 tablet 6   azelastine (ASTELIN) 0.1 % nasal spray Place 1 spray into the nose 2 (two) times daily as needed for allergies.      calcium carbonate (OS-CAL) 600 MG TABS tablet Take 600 mg by mouth daily.      cetirizine (ZYRTEC) 10 MG tablet Take 10 mg by mouth at bedtime as needed for allergies.     diclofenac (VOLTAREN) 75 MG EC tablet TAKE (1) TABLET BY MOUTH TWICE DAILY. 180 tablet 0   diclofenac sodium (VOLTAREN) 1 % GEL Apply 4 g topically 4 (four) times daily. 100 g 3   estradiol (ESTRACE) 0.1 MG/GM vaginal  cream Place 1 Applicatorful vaginally as directed.     estradiol (VIVELLE-DOT) 0.025 MG/24HR Place 1 patch onto the skin 2 (two) times a week.     fenofibrate 160 MG tablet TAKE ONE TABLET BY MOUTH ONCE DAILY. 90 tablet 0   fluticasone (FLONASE) 50 MCG/ACT nasal spray Place 2 sprays into both nostrils as needed for allergies.      furosemide (LASIX) 20 MG tablet Take 20 mg by mouth as needed for fluid.     ibuprofen (ADVIL) 200 MG tablet Take 400 mg by mouth every 6 (six) hours as needed for headache or moderate pain.     levothyroxine (SYNTHROID) 88 MCG tablet Take 88 mcg by mouth daily.      Multiple Vitamins-Minerals (CENTRUM SILVER 50+WOMEN) TABS Take by mouth.     Omega-3 Fatty Acids (FISH OIL) 1000 MG CAPS Take 1,000 mg by mouth daily.      omeprazole (PRILOSEC) 20 MG capsule TAKE (1) CAPSULE BY MOUTH ONCE DAILY. 90 capsule 0   ondansetron (ZOFRAN) 4 MG tablet Take 1 tablet (4 mg total) by mouth every 8 (eight) hours as needed for nausea or vomiting. 20 tablet 0   TURMERIC PO Take 550 mg by mouth daily.     vitamin E (VITAMIN E) 400 UNIT capsule Take 400 Units by mouth daily.      No current facility-administered medications for this visit.  Physical Exam: Vitals:   09/27/19 1501  BP: 128/72  Pulse: 74  SpO2: 94%  Weight: 154 lb (69.9 kg)  Height: 5\' 2"  (1.575 m)    GEN- The patient is well appearing, alert and oriented x 3 today.   Head- normocephalic, atraumatic Eyes-  Sclera clear, conjunctiva pink Ears- hearing intact Oropharynx- clear Lungs normal work of breathing Heart- Regular rate and rhythm  GI- soft  Extremities- no clubbing, cyanosis, or edema  Wt Readings from Last 3 Encounters:  09/27/19 154 lb (69.9 kg)  08/03/19 155 lb (70.3 kg)  07/06/19 158 lb 6.4 oz (71.8 kg)    EKG tracing ordered today is personally reviewed and shows sinus rhythm  Assessment and Plan:  1. Paroxysmal atrial fibrillation Well controlled  No afib by ILR  interrogation (personally reviewed today)  May consider [place] if she has further afib (chads2vasc score is 3)  2. HTN Stable No change required today  3. Vasovagal syncope Stable, without recurrence No change required today Loletha Grayer and pause turned on in the ILR today  4. HL Continue statin I have advised that she follow-up with Dr Melford Aase for lipids.  Risks, benefits and potential toxicities for medications prescribed and/or refilled reviewed with patient today.   Return to see me in 1months  Thompson Grayer MD, [facility] 09/27/2019 3:03 PM

## 2019-10-09 ENCOUNTER — Ambulatory Visit (INDEPENDENT_AMBULATORY_CARE_PROVIDER_SITE_OTHER): Payer: PPO | Admitting: *Deleted

## 2019-10-09 DIAGNOSIS — L570 Actinic keratosis: Secondary | ICD-10-CM | POA: Diagnosis not present

## 2019-10-09 DIAGNOSIS — I480 Paroxysmal atrial fibrillation: Secondary | ICD-10-CM

## 2019-10-09 DIAGNOSIS — L905 Scar conditions and fibrosis of skin: Secondary | ICD-10-CM | POA: Diagnosis not present

## 2019-10-09 DIAGNOSIS — D485 Neoplasm of uncertain behavior of skin: Secondary | ICD-10-CM | POA: Diagnosis not present

## 2019-10-09 DIAGNOSIS — R21 Rash and other nonspecific skin eruption: Secondary | ICD-10-CM | POA: Diagnosis not present

## 2019-10-09 DIAGNOSIS — C44519 Basal cell carcinoma of skin of other part of trunk: Secondary | ICD-10-CM | POA: Diagnosis not present

## 2019-10-09 DIAGNOSIS — Z85828 Personal history of other malignant neoplasm of skin: Secondary | ICD-10-CM | POA: Diagnosis not present

## 2019-10-09 DIAGNOSIS — D225 Melanocytic nevi of trunk: Secondary | ICD-10-CM | POA: Diagnosis not present

## 2019-10-09 DIAGNOSIS — D1801 Hemangioma of skin and subcutaneous tissue: Secondary | ICD-10-CM | POA: Diagnosis not present

## 2019-10-09 DIAGNOSIS — D0422 Carcinoma in situ of skin of left ear and external auricular canal: Secondary | ICD-10-CM | POA: Diagnosis not present

## 2019-10-09 DIAGNOSIS — C44719 Basal cell carcinoma of skin of left lower limb, including hip: Secondary | ICD-10-CM | POA: Diagnosis not present

## 2019-10-09 DIAGNOSIS — L820 Inflamed seborrheic keratosis: Secondary | ICD-10-CM | POA: Diagnosis not present

## 2019-10-09 DIAGNOSIS — L821 Other seborrheic keratosis: Secondary | ICD-10-CM | POA: Diagnosis not present

## 2019-10-09 LAB — CUP PACEART REMOTE DEVICE CHECK
Date Time Interrogation Session: 20210718231203
Implantable Pulse Generator Implant Date: 20200928

## 2019-10-11 NOTE — Progress Notes (Signed)
Carelink Summary Report / Loop Recorder 

## 2019-10-12 ENCOUNTER — Ambulatory Visit: Payer: PPO | Admitting: Plastic Surgery

## 2019-10-12 DIAGNOSIS — Z1231 Encounter for screening mammogram for malignant neoplasm of breast: Secondary | ICD-10-CM | POA: Diagnosis not present

## 2019-10-12 DIAGNOSIS — Z01419 Encounter for gynecological examination (general) (routine) without abnormal findings: Secondary | ICD-10-CM | POA: Diagnosis not present

## 2019-10-12 DIAGNOSIS — N951 Menopausal and female climacteric states: Secondary | ICD-10-CM | POA: Diagnosis not present

## 2019-10-12 DIAGNOSIS — Z6828 Body mass index (BMI) 28.0-28.9, adult: Secondary | ICD-10-CM | POA: Diagnosis not present

## 2019-10-18 ENCOUNTER — Encounter: Payer: Self-pay | Admitting: Plastic Surgery

## 2019-10-18 ENCOUNTER — Ambulatory Visit (INDEPENDENT_AMBULATORY_CARE_PROVIDER_SITE_OTHER): Payer: Self-pay | Admitting: Plastic Surgery

## 2019-10-18 ENCOUNTER — Other Ambulatory Visit: Payer: Self-pay

## 2019-10-18 VITALS — BP 140/68 | HR 75 | Temp 97.7°F

## 2019-10-18 DIAGNOSIS — L905 Scar conditions and fibrosis of skin: Secondary | ICD-10-CM

## 2019-10-18 NOTE — Progress Notes (Signed)
Patient presents now about 6 weeks out from fat grafting to the nose and marionette lines.  She initially felt that she had a nice correction of the marionette lines but now feels like she has lost that.  On the nose she still notices some subtle contour irregularities and would like to discuss other alternatives for treating that area.  On exam I agree that she does appear to be less full in the marionette lines compared to her last visit which was 6 weeks ago.  Her nose has some very subtle contour irregularities from previous Mohs procedures but I do think that this is filled out and looks a little bit better than preoperative.  I had a discussion with her about fat grafting in the lower face.  I explained that if it is overdone then he can have an unattractive appearance of the lower face if it is too full.  I do acknowledge that she did not have that desired correction however.  I did offer to place Juvderm ultra plus XC in her marionette lines which she appreciated.  I prepped out the area with an alcohol pad and distributed 1 cc between the right and left marionette lines with a nice correction which she was able to visualize and agreed that that addressed her concerns.  Regarding the nose we did discuss dermabrasion versus a shave of that area to try to improve the contour.  I will look into this and get back in touch with the patient.

## 2019-10-25 ENCOUNTER — Ambulatory Visit
Admission: EM | Admit: 2019-10-25 | Discharge: 2019-10-25 | Disposition: A | Discharge status: Home / Self Care | Payer: PPO | Attending: Emergency Medicine | Admitting: Emergency Medicine

## 2019-10-25 ENCOUNTER — Ambulatory Visit: Payer: Self-pay

## 2019-10-25 ENCOUNTER — Other Ambulatory Visit: Payer: Self-pay

## 2019-10-25 DIAGNOSIS — Z1152 Encounter for screening for COVID-19: Secondary | ICD-10-CM | POA: Diagnosis not present

## 2019-10-25 NOTE — ED Triage Notes (Signed)
Pt here for covid test after exposure on Saturday

## 2019-10-25 NOTE — Discharge Instructions (Addendum)
COVID testing ordered.  It will take between 2-7 days for test results.  Someone will contact you regarding abnormal results.    In the meantime: You should remain isolated in your home for 10 days from symptom onset AND greater than 24 hours after symptoms resolution (absence of fever without the use of fever-reducing medication and improvement in respiratory symptoms), whichever is longer Get plenty of rest and push fluids Use OTC medications like ibuprofen or tylenol as needed fever or pain Call or go to the ED if you have any new or worsening symptoms such as fever, worsening cough, shortness of breath, chest tightness, chest pain, turning blue, changes in mental status, etc..Marland Kitchen

## 2019-10-25 NOTE — ED Provider Notes (Signed)
[place]   [id number] 10/25/19 Arrival Time: 4008   CC: COVID symptoms  SUBJECTIVE: History from: patient.  Paige Jones is a 69 y.o. female who presents for COVID testing after Covid exposure.  Denies sick exposure to flu or strep.  Denies recent travel.  Denies aggravating or alleviating symptoms.  Denies previous COVID infection.   Denies fever, chills, fatigue, nasal congestion, rhinorrhea, sore throat, cough, SOB, wheezing, chest pain, nausea, vomiting, changes in bowel or bladder habits.    ROS: As per HPI.  All other pertinent ROS negative.      Past Medical History:  Diagnosis Date  . Arachnoid cyst   . Arthritis   . Basal cell carcinoma   . Hyperlipidemia   . Hypertension   . Paroxysmal atrial fibrillation (HCC)   . Thyroid goiter    Past Surgical History:  Procedure Laterality Date  . ATRIAL FIBRILLATION ABLATION N/A 08/30/2018   Procedure: ATRIAL FIBRILLATION ABLATION;  Surgeon: Thompson Grayer, MD;  Location: [facility];  Service: Cardiovascular;  Laterality: N/A;  . BASAL CELL CARCINOMA EXCISION    . HYSTERECTOMY ABDOMINAL WITH SALPINGECTOMY    . implantable loop recorder implantation  12/19/2018    Medtronic Reveal [place] model [id number] (SN [id number])  implantable loop recorder implanted by Dr [name] in office for afib management post ablation  . TONSILLECTOMY AND ADENOIDECTOMY     Allergies  Allergen Reactions  . Melatonin Itching    "Keeps me awake"  . Pseudoephedrine     Elevated BP  . Rosuvastatin     headache  . Benadryl [Diphenhydramine] Itching  . Prednisone     Went into afib   No current facility-administered medications on file prior to encounter.   Current Outpatient Medications on File Prior to Encounter  Medication Sig Dispense Refill  . atorvastatin (LIPITOR) 20 MG tablet TAKE ONE TABLET BY MOUTH ONCE DAILY. 30 tablet 6  . azelastine (ASTELIN) 0.1 % nasal spray Place 1 spray into the nose 2 (two) times daily as needed  for allergies.     . calcium carbonate (OS-CAL) 600 MG TABS tablet Take 600 mg by mouth daily.     . cetirizine (ZYRTEC) 10 MG tablet Take 10 mg by mouth at bedtime as needed for allergies.    Marland Kitchen diclofenac (VOLTAREN) 75 MG EC tablet TAKE (1) TABLET BY MOUTH TWICE DAILY. 180 tablet 0  . diclofenac sodium (VOLTAREN) 1 % GEL Apply 4 g topically 4 (four) times daily. 100 g 3  . estradiol (ESTRACE) 0.1 MG/GM vaginal cream Place 1 Applicatorful vaginally as directed.    Marland Kitchen estradiol (VIVELLE-DOT) 0.025 MG/24HR Place 1 patch onto the skin 2 (two) times a week.    . fenofibrate 160 MG tablet TAKE ONE TABLET BY MOUTH ONCE DAILY. 90 tablet 0  . fluticasone (FLONASE) 50 MCG/ACT nasal spray Place 2 sprays into both nostrils as needed for allergies.     . furosemide (LASIX) 20 MG tablet Take 20 mg by mouth as needed for fluid.    Marland Kitchen ibuprofen (ADVIL) 200 MG tablet Take 400 mg by mouth every 6 (six) hours as needed for headache or moderate pain.    Marland Kitchen levothyroxine (SYNTHROID) 88 MCG tablet Take 88 mcg by mouth daily.     . Multiple Vitamins-Minerals (CENTRUM SILVER 50+WOMEN) TABS Take by mouth.    . Omega-3 Fatty Acids (FISH OIL) 1000 MG CAPS Take 1,000 mg by mouth daily.     Marland Kitchen omeprazole (PRILOSEC) 20 MG capsule  TAKE (1) CAPSULE BY MOUTH ONCE DAILY. 90 capsule 0  . ondansetron (ZOFRAN) 4 MG tablet Take 1 tablet (4 mg total) by mouth every 8 (eight) hours as needed for nausea or vomiting. 20 tablet 0  . TURMERIC PO Take 550 mg by mouth daily.    . vitamin E (VITAMIN E) 400 UNIT capsule Take 400 Units by mouth daily.      Social History   Socioeconomic History  . Marital status: Married    Spouse name: Not on file  . Number of children: Not on file  . Years of education: Not on file  . Highest education level: Not on file  Occupational History  . Not on file  Tobacco Use  . Smoking status: Never Smoker  . Smokeless tobacco: Never Used  Vaping Use  . Vaping Use: Never used  Substance and Sexual  Activity  . Alcohol use: No    Alcohol/week: 0.0 standard drinks  . Drug use: No  . Sexual activity: Not on file  Other Topics Concern  . Not on file  Social History Narrative   Lives in [place] with spouse.   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   Social Determinants of Health   Financial Resource Strain:   . Difficulty of Paying Living Expenses:   Food Insecurity:   . Worried About Charity fundraiser in the Last Year:   . Arboriculturist in the Last Year:   Transportation Needs:   . Film/video editor (Medical):   Marland Kitchen Lack of Transportation (Non-Medical):   Physical Activity:   . Days of Exercise per Week:   . Minutes of Exercise per Session:   Stress:   . Feeling of Stress :   Social Connections:   . Frequency of Communication with Friends and Family:   . Frequency of Social Gatherings with Friends and Family:   . Attends Religious Services:   . Active Member of Clubs or Organizations:   . Attends Archivist Meetings:   Marland Kitchen Marital Status:   Intimate Partner Violence:   . Fear of Current or Ex-Partner:   . Emotionally Abused:   Marland Kitchen Physically Abused:   . Sexually Abused:    Family History  Problem Relation Age of Onset  . Thyroid disease Mother   . Atrial fibrillation Mother   . Fibromyalgia Mother   . Arthritis Mother   . Emphysema Father   . Diabetes Father   . Heart disease Father   . Cancer Father        gall bladder    OBJECTIVE:  Vitals:   10/25/19 1432  BP: 137/76  Pulse: 67  Resp: 20  Temp: 98.3 F (36.8 C)  SpO2: 97%     General appearance: alert; appears fatigued, but nontoxic; speaking in full sentences and tolerating own secretions HEENT: NCAT; Ears: EACs clear, TMs pearly gray; Eyes: PERRL.  EOM grossly intact. Sinuses: nontender; Nose: nares patent without rhinorrhea, Throat: oropharynx clear, tonsils non erythematous or enlarged, uvula midline  Neck: supple without LAD Lungs:  unlabored respirations, symmetrical air entry; cough: absent; no respiratory distress; CTAB Heart: regular rate and rhythm.  Radial pulses 2+ symmetrical bilaterally Skin: warm and dry Psychological: alert and cooperative; normal mood and affect  LABS:  No results found for this or any previous visit (from the past 24 hour(s)).   ASSESSMENT & PLAN:  1. Encounter for screening for COVID-19  No orders of the defined types were placed in this encounter.   Discharge Instructions.    COVID testing ordered.  It will take between 2-7 days for test results.  Someone will contact you regarding abnormal results.    In the meantime: You should remain isolated in your home for 10 days from symptom onset AND greater than 24 hours after symptoms resolution (absence of fever without the use of fever-reducing medication and improvement in respiratory symptoms), whichever is longer Get plenty of rest and push fluids Use OTC medications like ibuprofen or tylenol as needed fever or pain Call or go to the ED if you have any new or worsening symptoms such as fever, worsening cough, shortness of breath, chest tightness, chest pain, turning blue, changes in mental status, etc...   Reviewed expectations re: course of current medical issues. Questions answered. Outlined signs and symptoms indicating need for more acute intervention. Patient verbalized understanding. After Visit Summary given.      Note: This document was prepared using Dragon voice recognition software and may include unintentional dictation errors.    Emerson Monte, [place] 10/25/19 1458

## 2019-10-26 LAB — NOVEL CORONAVIRUS, NAA: SARS-CoV-2, NAA: NOT DETECTED

## 2019-10-26 LAB — SARS-COV-2, NAA 2 DAY TAT

## 2019-11-07 DIAGNOSIS — D0422 Carcinoma in situ of skin of left ear and external auricular canal: Secondary | ICD-10-CM | POA: Diagnosis not present

## 2019-11-12 LAB — CUP PACEART REMOTE DEVICE CHECK
Date Time Interrogation Session: 20210819000941
Implantable Pulse Generator Implant Date: 20200928

## 2019-11-13 ENCOUNTER — Ambulatory Visit (INDEPENDENT_AMBULATORY_CARE_PROVIDER_SITE_OTHER): Payer: PPO | Admitting: *Deleted

## 2019-11-13 DIAGNOSIS — I480 Paroxysmal atrial fibrillation: Secondary | ICD-10-CM

## 2019-11-17 NOTE — Progress Notes (Signed)
Carelink Summary Report / Loop Recorder 

## 2019-11-21 DIAGNOSIS — C44519 Basal cell carcinoma of skin of other part of trunk: Secondary | ICD-10-CM | POA: Diagnosis not present

## 2019-11-21 DIAGNOSIS — C44719 Basal cell carcinoma of skin of left lower limb, including hip: Secondary | ICD-10-CM | POA: Diagnosis not present

## 2019-12-12 DIAGNOSIS — E782 Mixed hyperlipidemia: Secondary | ICD-10-CM | POA: Diagnosis not present

## 2019-12-12 DIAGNOSIS — Z0000 Encounter for general adult medical examination without abnormal findings: Secondary | ICD-10-CM | POA: Diagnosis not present

## 2019-12-12 DIAGNOSIS — K219 Gastro-esophageal reflux disease without esophagitis: Secondary | ICD-10-CM | POA: Diagnosis not present

## 2019-12-12 DIAGNOSIS — M1990 Unspecified osteoarthritis, unspecified site: Secondary | ICD-10-CM | POA: Diagnosis not present

## 2019-12-12 DIAGNOSIS — E038 Other specified hypothyroidism: Secondary | ICD-10-CM | POA: Diagnosis not present

## 2019-12-12 DIAGNOSIS — R7301 Impaired fasting glucose: Secondary | ICD-10-CM | POA: Diagnosis not present

## 2019-12-12 DIAGNOSIS — I480 Paroxysmal atrial fibrillation: Secondary | ICD-10-CM | POA: Diagnosis not present

## 2019-12-12 DIAGNOSIS — Z23 Encounter for immunization: Secondary | ICD-10-CM | POA: Diagnosis not present

## 2019-12-17 LAB — CUP PACEART REMOTE DEVICE CHECK
Date Time Interrogation Session: 20210919002932
Implantable Pulse Generator Implant Date: 20200928

## 2019-12-18 ENCOUNTER — Ambulatory Visit (INDEPENDENT_AMBULATORY_CARE_PROVIDER_SITE_OTHER): Payer: PPO | Admitting: Emergency Medicine

## 2019-12-18 DIAGNOSIS — I480 Paroxysmal atrial fibrillation: Secondary | ICD-10-CM

## 2019-12-20 NOTE — Progress Notes (Signed)
Carelink Summary Report / Loop Recorder 

## 2020-01-10 ENCOUNTER — Ambulatory Visit (INDEPENDENT_AMBULATORY_CARE_PROVIDER_SITE_OTHER): Payer: PPO

## 2020-01-10 DIAGNOSIS — Z85828 Personal history of other malignant neoplasm of skin: Secondary | ICD-10-CM | POA: Diagnosis not present

## 2020-01-10 DIAGNOSIS — L905 Scar conditions and fibrosis of skin: Secondary | ICD-10-CM | POA: Diagnosis not present

## 2020-01-10 DIAGNOSIS — I480 Paroxysmal atrial fibrillation: Secondary | ICD-10-CM

## 2020-01-10 DIAGNOSIS — C44612 Basal cell carcinoma of skin of right upper limb, including shoulder: Secondary | ICD-10-CM | POA: Diagnosis not present

## 2020-01-10 DIAGNOSIS — L821 Other seborrheic keratosis: Secondary | ICD-10-CM | POA: Diagnosis not present

## 2020-01-10 DIAGNOSIS — L570 Actinic keratosis: Secondary | ICD-10-CM | POA: Diagnosis not present

## 2020-01-10 DIAGNOSIS — D225 Melanocytic nevi of trunk: Secondary | ICD-10-CM | POA: Diagnosis not present

## 2020-01-10 DIAGNOSIS — L718 Other rosacea: Secondary | ICD-10-CM | POA: Diagnosis not present

## 2020-01-10 DIAGNOSIS — D485 Neoplasm of uncertain behavior of skin: Secondary | ICD-10-CM | POA: Diagnosis not present

## 2020-01-10 DIAGNOSIS — D1801 Hemangioma of skin and subcutaneous tissue: Secondary | ICD-10-CM | POA: Diagnosis not present

## 2020-01-10 LAB — CUP PACEART REMOTE DEVICE CHECK
Date Time Interrogation Session: 20211020021257
Implantable Pulse Generator Implant Date: 20200928

## 2020-01-16 NOTE — Progress Notes (Signed)
Carelink Summary Report / Loop Recorder 

## 2020-02-02 DIAGNOSIS — K219 Gastro-esophageal reflux disease without esophagitis: Secondary | ICD-10-CM | POA: Diagnosis not present

## 2020-02-12 ENCOUNTER — Ambulatory Visit (INDEPENDENT_AMBULATORY_CARE_PROVIDER_SITE_OTHER): Payer: PPO

## 2020-02-12 DIAGNOSIS — I480 Paroxysmal atrial fibrillation: Secondary | ICD-10-CM | POA: Diagnosis not present

## 2020-02-12 LAB — CUP PACEART REMOTE DEVICE CHECK
Date Time Interrogation Session: 20211120013253
Implantable Pulse Generator Implant Date: 20200928

## 2020-02-14 NOTE — Progress Notes (Signed)
Carelink Summary Report / Loop Recorder 

## 2020-03-12 LAB — CUP PACEART REMOTE DEVICE CHECK
Date Time Interrogation Session: 20211221013212
Implantable Pulse Generator Implant Date: 20200928

## 2020-03-18 ENCOUNTER — Ambulatory Visit (INDEPENDENT_AMBULATORY_CARE_PROVIDER_SITE_OTHER): Payer: PPO

## 2020-03-18 DIAGNOSIS — I480 Paroxysmal atrial fibrillation: Secondary | ICD-10-CM | POA: Diagnosis not present

## 2020-03-27 DIAGNOSIS — C44612 Basal cell carcinoma of skin of right upper limb, including shoulder: Secondary | ICD-10-CM | POA: Diagnosis not present

## 2020-03-29 NOTE — Progress Notes (Signed)
Carelink Summary Report / Loop Recorder 

## 2020-04-04 DIAGNOSIS — K219 Gastro-esophageal reflux disease without esophagitis: Secondary | ICD-10-CM | POA: Diagnosis not present

## 2020-04-04 DIAGNOSIS — K2100 Gastro-esophageal reflux disease with esophagitis, without bleeding: Secondary | ICD-10-CM | POA: Diagnosis not present

## 2020-04-17 ENCOUNTER — Other Ambulatory Visit: Payer: Self-pay

## 2020-04-17 ENCOUNTER — Encounter: Payer: Self-pay | Admitting: Podiatry

## 2020-04-17 ENCOUNTER — Ambulatory Visit (INDEPENDENT_AMBULATORY_CARE_PROVIDER_SITE_OTHER): Payer: PPO

## 2020-04-17 ENCOUNTER — Ambulatory Visit: Payer: PPO | Admitting: Podiatry

## 2020-04-17 DIAGNOSIS — M7661 Achilles tendinitis, right leg: Secondary | ICD-10-CM | POA: Diagnosis not present

## 2020-04-17 DIAGNOSIS — M722 Plantar fascial fibromatosis: Secondary | ICD-10-CM

## 2020-04-17 NOTE — Patient Instructions (Signed)

## 2020-04-21 NOTE — Progress Notes (Signed)
Subjective:   Patient ID: Paige Jones, female   DOB: 70 y.o.   MRN: [id number]   HPI Patient states she developed a lot of pain in the bottom of her right heel over the last few weeks and does not remember specific injury.  States it is hard for her to bear weight down on it patient does not smoke and likes to be active   Review of Systems  All other systems reviewed and are negative.       Objective:  Physical Exam Vitals and nursing note reviewed.  Constitutional:      Appearance: She is well-developed and well-nourished.  Cardiovascular:     Pulses: Intact distal pulses.  Pulmonary:     Effort: Pulmonary effort is normal.  Musculoskeletal:        General: Normal range of motion.  Skin:    General: Skin is warm.  Neurological:     Mental Status: She is alert.     Neurovascular status intact muscle strength found to be adequate range of motion within normal limits.  Patient is found to have exquisite discomfort plantar aspect right heel at the insertional point of the tendon into the calcaneus with inflammation around the medial band.  Patient is found to have good arch height has good digital perfusion well oriented x3     Assessment:  Acute plantar fasciitis right with possibility for inflammatory changes     Plan:  H&P x-rays reviewed condition discussed at great length.  I discussed different causes for condition and today I went ahead did sterile prep and injected the plantar fascia 3 mg Dexasone Kenalog 5 mg Xylocaine advised on anti-inflammatory physical therapy and instructed on topical anti-inflammatory usage.  Patient will be seen back to recheck  X-rays indicate minimal spur no indication stress fracture arthritis

## 2020-04-22 ENCOUNTER — Ambulatory Visit (INDEPENDENT_AMBULATORY_CARE_PROVIDER_SITE_OTHER): Payer: PPO

## 2020-04-22 DIAGNOSIS — I480 Paroxysmal atrial fibrillation: Secondary | ICD-10-CM

## 2020-04-22 LAB — CUP PACEART REMOTE DEVICE CHECK
Date Time Interrogation Session: 20220129234306
Implantable Pulse Generator Implant Date: 20200928

## 2020-04-23 ENCOUNTER — Other Ambulatory Visit: Payer: Self-pay | Admitting: Physician Assistant

## 2020-04-23 DIAGNOSIS — U071 COVID-19: Secondary | ICD-10-CM

## 2020-04-24 ENCOUNTER — Other Ambulatory Visit: Payer: Self-pay | Admitting: Nurse Practitioner

## 2020-04-24 DIAGNOSIS — U071 COVID-19: Secondary | ICD-10-CM | POA: Insufficient documentation

## 2020-04-24 MED ORDER — MOLNUPIRAVIR EUA 200MG CAPSULE: 4.0000 | ORAL_CAPSULE | Freq: Two times a day (BID) | ORAL | 0 refills | Status: AC

## 2020-04-24 MED FILL — MOLNUPIRAVIR 200 MG CAPS: 200 | 5 days supply | Qty: 40 | Fill #0

## 2020-04-24 NOTE — Progress Notes (Signed)
Outpatient Oral COVID Treatment Note  I connected with Paige Jones on 04/24/2020/12:37 PM by telephone and verified that I am speaking with the correct person using two identifiers.  I discussed the limitations, risks, security, and privacy concerns of performing an evaluation and management service by telephone and the availability of in person appointments. I also discussed with the patient that there may be a patient responsible charge related to this service. The patient expressed understanding and agreed to proceed.  Patient location: Home Provider location: Office  Diagnosis: COVID-19 infection  Purpose of visit: Discussion of potential use of Molnupiravir or Paxlovid, a new treatment for mild to moderate COVID-19 viral infection in non-hospitalized patients.   Subjective: Patient is a 70 y.o. female who has been diagnosed with COVID 19 viral infection.  Their symptoms began on 04/21/2020 with sore throat, fatigue.    Past Medical History:  Diagnosis Date  . Arachnoid cyst   . Arthritis   . Basal cell carcinoma   . Hyperlipidemia   . Hypertension   . Paroxysmal atrial fibrillation (HCC)   . Thyroid goiter     Allergies  Allergen Reactions  . Melatonin Itching    "Keeps me awake"  . Pseudoephedrine     Elevated BP  . Rosuvastatin     headache  . Benadryl [Diphenhydramine] Itching  . Prednisone     Went into afib     Current Outpatient Medications:  .  molnupiravir EUA 200 mg CAPS, Take 4 capsules (800 mg total) by mouth 2 (two) times daily for 5 days., Disp: 40 capsule, Rfl: 0 .  atorvastatin (LIPITOR) 20 MG tablet, TAKE ONE TABLET BY MOUTH ONCE DAILY., Disp: 30 tablet, Rfl: 6 .  azelastine (ASTELIN) 0.1 % nasal spray, Place 1 spray into the nose 2 (two) times daily as needed for allergies. , Disp: , Rfl:  .  calcium carbonate (OS-CAL) 600 MG TABS tablet, Take 600 mg by mouth daily. , Disp: , Rfl:  .  cetirizine (ZYRTEC) 10 MG tablet, Take 10 mg by mouth at bedtime as  needed for allergies., Disp: , Rfl:  .  diclofenac (VOLTAREN) 75 MG EC tablet, TAKE (1) TABLET BY MOUTH TWICE DAILY., Disp: 180 tablet, Rfl: 0 .  diclofenac sodium (VOLTAREN) 1 % GEL, Apply 4 g topically 4 (four) times daily., Disp: 100 g, Rfl: 3 .  estradiol (ESTRACE) 0.1 MG/GM vaginal cream, Place 1 Applicatorful vaginally as directed., Disp: , Rfl:  .  estradiol (VIVELLE-DOT) 0.025 MG/24HR, Place 1 patch onto the skin 2 (two) times a week., Disp: , Rfl:  .  fenofibrate 160 MG tablet, TAKE ONE TABLET BY MOUTH ONCE DAILY., Disp: 90 tablet, Rfl: 0 .  fluticasone (FLONASE) 50 MCG/ACT nasal spray, Place 2 sprays into both nostrils as needed for allergies. , Disp: , Rfl:  .  furosemide (LASIX) 20 MG tablet, Take 20 mg by mouth as needed for fluid., Disp: , Rfl:  .  ibuprofen (ADVIL) 200 MG tablet, Take 400 mg by mouth every 6 (six) hours as needed for headache or moderate pain., Disp: , Rfl:  .  levothyroxine (SYNTHROID) 88 MCG tablet, Take 88 mcg by mouth daily. , Disp: , Rfl:  .  Multiple Vitamins-Minerals (CENTRUM SILVER 50+WOMEN) TABS, Take by mouth., Disp: , Rfl:  .  Omega-3 Fatty Acids (FISH OIL) 1000 MG CAPS, Take 1,000 mg by mouth daily. , Disp: , Rfl:  .  omeprazole (PRILOSEC) 20 MG capsule, TAKE (1) CAPSULE BY MOUTH ONCE DAILY., Disp:  90 capsule, Rfl: 0 .  ondansetron (ZOFRAN) 4 MG tablet, Take 1 tablet (4 mg total) by mouth every 8 (eight) hours as needed for nausea or vomiting., Disp: 20 tablet, Rfl: 0 .  TURMERIC PO, Take 550 mg by mouth daily., Disp: , Rfl:  .  vitamin E (VITAMIN E) 400 UNIT capsule, Take 400 Units by mouth daily. , Disp: , Rfl:   Objective: Patient appears/sounds tired, no SOB, no audible cough.  They are in no apparent distress.  Breathing is non labored.  Mood and behavior are normal.  Laboratory Data:  Recent Results (from the past 2160 hour(s))  CUP PACEART REMOTE DEVICE CHECK     Status: None   Collection Time: 02/10/20  1:32 AM  Result Value Ref Range   Date  Time Interrogation Session [id number]    Pulse Generator Manufacturer MERM    Pulse Gen Model [id number] Reveal LINQ    Pulse Gen Serial Number [id number]    Clinic Name [facility]    Implantable Pulse Generator Type ICM/ILR    Implantable Pulse Generator Implant Date [id number]   CUP PACEART REMOTE DEVICE CHECK     Status: None   Collection Time: 03/12/20  1:32 AM  Result Value Ref Range   Date Time Interrogation Session [id number]    Pulse Generator Manufacturer MERM    Pulse Gen Model [id number] Reveal LINQ    Pulse Gen Serial Number [id number]    Cl[facility]    Implantable Pulse Generator Type ICM/ILR    Implantable Pulse Generator Implant Date [id number]   CUP PACEART REMOTE DEVICE CHECK     Status: None   Collection Time: 04/20/20 11:43 PM  Result Value Ref Range   Date Time Interrogation Session [id number]    Pulse Generator Manufacturer MERM    Pulse Gen Model [id number] Reveal LINQ    Pulse Gen Serial Number [id number]    [facility]    Implantable Pulse Generator Type ICM/ILR    Implantable Pulse Generator Implant Date [id number]      Assessment: 70 y.o. female with mild/moderate COVID 19 viral infection diagnosed on 04/21/2020 at high risk for progression to severe COVID 19.  Plan:  This patient is a 70 y.o. female that meets the following criteria for Emergency Use Authorization of: Molnupiravir  1. Age >18 yr 2. SARS-COV-2 positive test 3. Symptom onset < 5 days 4. Mild-to-moderate COVID disease with high risk for severe progression to hospitalization or death   I have spoken and communicated the following to the patient or parent/caregiver regarding: 1. Molnupiravir is an unapproved drug that is authorized for use under an Print production planner.  2. There are no adequate, approved, available products for the treatment of COVID-19 in adults who have mild-to-moderate COVID-19 and are at high risk for progressing to severe COVID-19,  including hospitalization or death. 3. Other therapeutics are currently authorized. For additional information on all products authorized for treatment or prevention of COVID-19, please see [email or web address].  4. There are benefits and risks of taking this treatment as outlined in the "Fact Sheet for Patients and Caregivers."  5. "Fact Sheet for Patients and Caregivers" was reviewed with patient. A hard copy will be provided to patient from pharmacy prior to the patient receiving treatment. 6. Patients should continue to self-isolate and use infection control measures (e.g., wear mask, isolate, social distance, avoid sharing personal items, clean and disinfect "high touch" surfaces, and frequent handwashing) according to CDC guidelines.  7. The  patient or parent/caregiver has the option to accept or refuse treatment. 8. [facility] has established a pregnancy surveillance program. 9. Females of childbearing potential should use a reliable method of contraception correctly and consistently, as applicable, for the duration of treatment and for 4 days after the last dose of Molnupiravir. 57. Males of reproductive potential who are sexually active with females of childbearing potential should use a reliable method of contraception correctly and consistently during treatment and for at least 3 months after the last dose. 11. Pregnancy status and risk was assessed. Patient verbalized understanding of precautions.   After reviewing above information with the patient, the patient agrees to receive molnupiravir.  Follow up instructions:    . Take prescription BID x 5 days as directed . Reach out to pharmacist for counseling on medication if desired . For concerns regarding further COVID symptoms please follow up with your PCP or urgent care . For urgent or life-threatening issues, seek care at  your local emergency department  The patient was provided an opportunity to ask questions, and all were answered. The patient agreed with the plan and demonstrated an understanding of the instructions.   Script sent to [facility] and opted to pick up RX.  The patient was advised to call their PCP or seek an in-person evaluation if the symptoms worsen or if the condition fails to improve as anticipated.   I provided 39 minutes of non face-to-face telephone visit time during this encounter, and > 50% was spent counseling as documented under my assessment & plan.  Payton Emerald, NP 04/24/2020 /12:37 PM

## 2020-05-01 NOTE — Progress Notes (Signed)
Carelink Summary Report / Loop Recorder 

## 2020-05-16 DIAGNOSIS — L905 Scar conditions and fibrosis of skin: Secondary | ICD-10-CM | POA: Diagnosis not present

## 2020-05-16 DIAGNOSIS — C44222 Squamous cell carcinoma of skin of right ear and external auricular canal: Secondary | ICD-10-CM | POA: Diagnosis not present

## 2020-05-16 DIAGNOSIS — D485 Neoplasm of uncertain behavior of skin: Secondary | ICD-10-CM | POA: Diagnosis not present

## 2020-05-16 DIAGNOSIS — D225 Melanocytic nevi of trunk: Secondary | ICD-10-CM | POA: Diagnosis not present

## 2020-05-16 DIAGNOSIS — L538 Other specified erythematous conditions: Secondary | ICD-10-CM | POA: Diagnosis not present

## 2020-05-16 DIAGNOSIS — L570 Actinic keratosis: Secondary | ICD-10-CM | POA: Diagnosis not present

## 2020-05-16 DIAGNOSIS — L918 Other hypertrophic disorders of the skin: Secondary | ICD-10-CM | POA: Diagnosis not present

## 2020-05-16 DIAGNOSIS — R21 Rash and other nonspecific skin eruption: Secondary | ICD-10-CM | POA: Diagnosis not present

## 2020-05-16 DIAGNOSIS — L718 Other rosacea: Secondary | ICD-10-CM | POA: Diagnosis not present

## 2020-05-16 DIAGNOSIS — Z85828 Personal history of other malignant neoplasm of skin: Secondary | ICD-10-CM | POA: Diagnosis not present

## 2020-05-22 ENCOUNTER — Encounter: Payer: Self-pay | Admitting: Podiatry

## 2020-05-22 ENCOUNTER — Ambulatory Visit (INDEPENDENT_AMBULATORY_CARE_PROVIDER_SITE_OTHER): Payer: Medicare Other | Admitting: Podiatry

## 2020-05-22 ENCOUNTER — Other Ambulatory Visit: Payer: Self-pay

## 2020-05-22 DIAGNOSIS — M722 Plantar fascial fibromatosis: Secondary | ICD-10-CM

## 2020-05-22 DIAGNOSIS — M7661 Achilles tendinitis, right leg: Secondary | ICD-10-CM

## 2020-05-22 MED ORDER — TRIAMCINOLONE ACETONIDE 10 MG/ML IJ SUSP
10.0000 mg | Freq: Once | INTRAMUSCULAR | Status: AC
  Administered 2020-05-22: 10 mg

## 2020-05-27 ENCOUNTER — Ambulatory Visit (INDEPENDENT_AMBULATORY_CARE_PROVIDER_SITE_OTHER): Payer: Medicare Other

## 2020-05-27 DIAGNOSIS — I480 Paroxysmal atrial fibrillation: Secondary | ICD-10-CM

## 2020-05-27 NOTE — Progress Notes (Signed)
Subjective:   Patient ID: Paige Jones, female   DOB: 70 y.o.   MRN: [id number]   HPI Patient states the heel is feeling somewhat better than it was but she is having pain in the back of the heel stating that has become inflamed    ROS      Objective:  Physical Exam  Neurovascular status found to be intact muscle strength adequate with patient found to have inflammation more in the posterior aspect of the left heel now with the plantar heel doing fairly well with mild discomfort still noted     Assessment:  Posterior Achilles tendinitis left with plantar fasciitis improving     Plan:  H&P reviewed both conditions separately.  For the posterior discomfort I recommend an injection I explained risk of doing this including ruptured patient wants procedure and I did sterile prep injected just the lateral aspect of the Achilles insertion 3 mg Dexasone Kenalog 5 mg Xylocaine leaving the medial and central alone.  I then advised on ice therapy and we reviewed plantar fasciitis we will continue with heel lifts anti-inflammatories topical medicines and shoe gear modifications

## 2020-05-29 LAB — CUP PACEART REMOTE DEVICE CHECK
Date Time Interrogation Session: 20220301234356
Implantable Pulse Generator Implant Date: 20200928

## 2020-06-05 NOTE — Progress Notes (Signed)
Carelink Summary Report / Loop Recorder 

## 2020-06-11 IMAGING — CT CT MAXILLOFACIAL W/O CM
3 series · 15 of 47 positions shown, 18 images · non-contrast
Comparison: Brain MRI 01/31/2017.

CLINICAL DATA: Syncope and fall today with a blow to the face.
Initial encounter.

EXAM:
CT HEAD WITHOUT CONTRAST
CT MAXILLOFACIAL WITHOUT CONTRAST
CT CERVICAL SPINE WITHOUT CONTRAST
TECHNIQUE: Multidetector CT imaging of the head, cervical spine, and
maxillofacial structures were performed using the standard protocol
without intravenous contrast. Multiplanar CT image reconstructions
of the cervical spine and maxillofacial structures were also
generated.

[Series 2: max soft · axial · 0.34mm/px · z∈[+1533,+1675]mm · 9 of 83 slices shown, 12 images]
[im 6/83  brain]
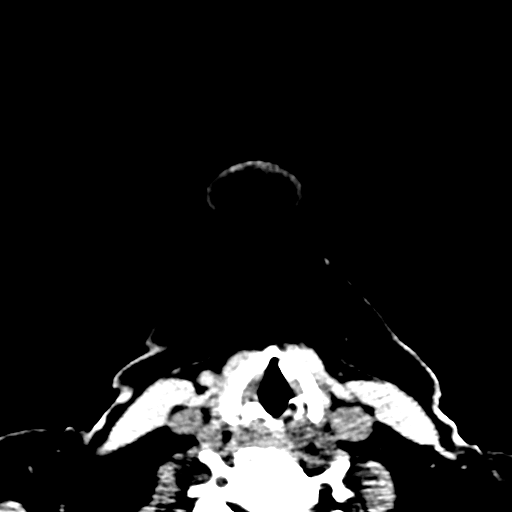
[im 6/83  bone]
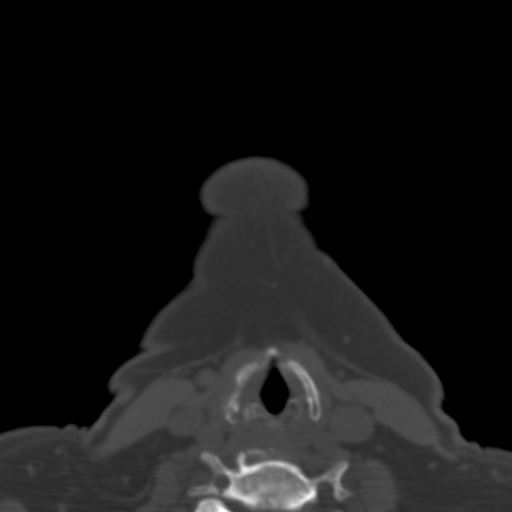
[im 15/83  bone]
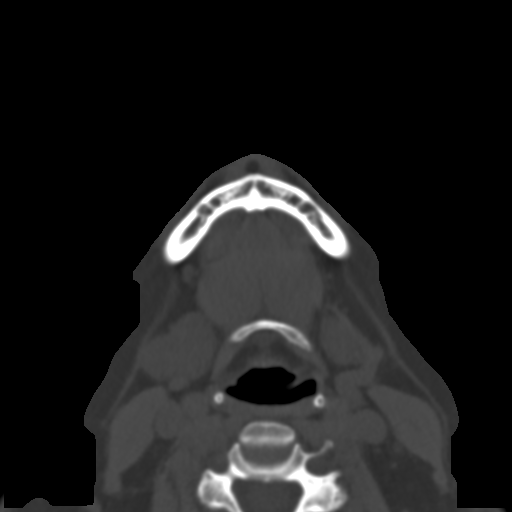
[im 23/83  bone]
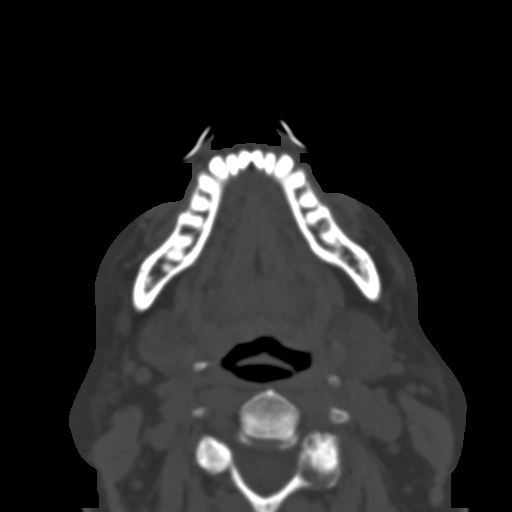
[im 32/83  bone]
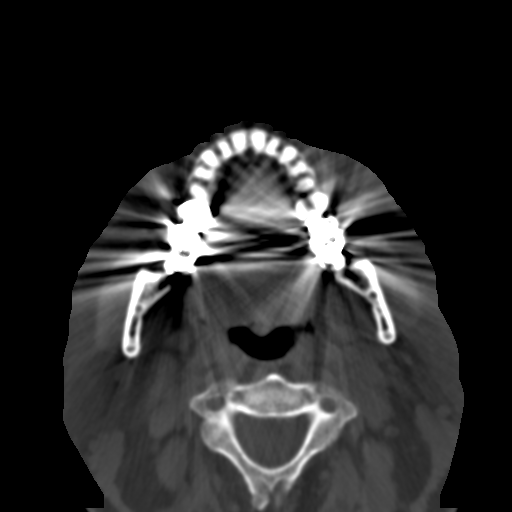
[im 43/83  brain]
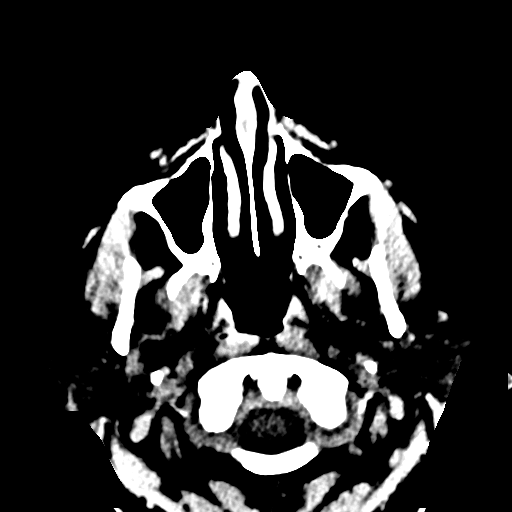
[im 43/83  bone]
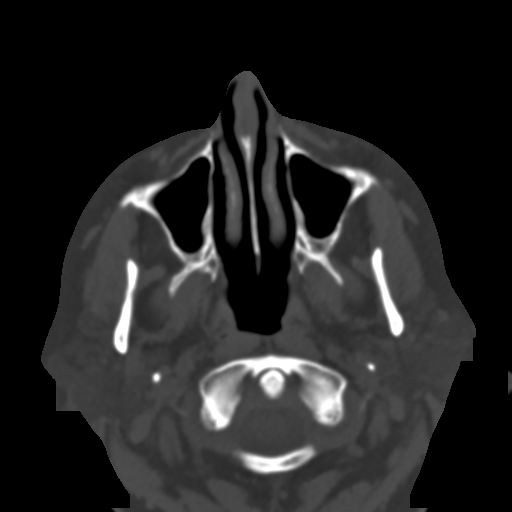
[im 51/83  bone]
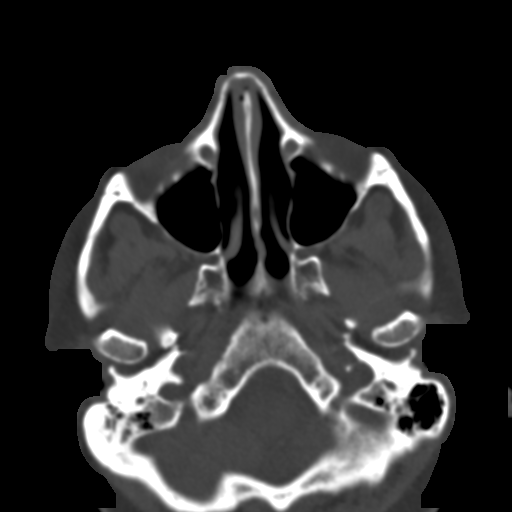
[im 60/83  bone]
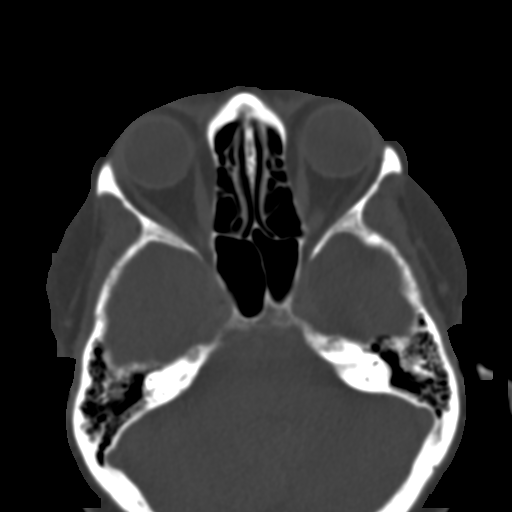
[im 68/83  bone]
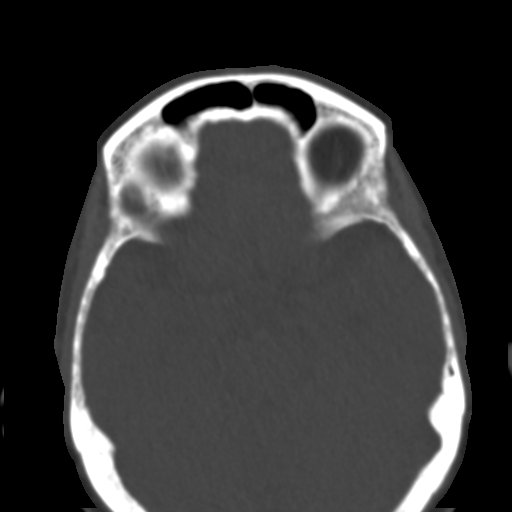
[im 77/83  brain]
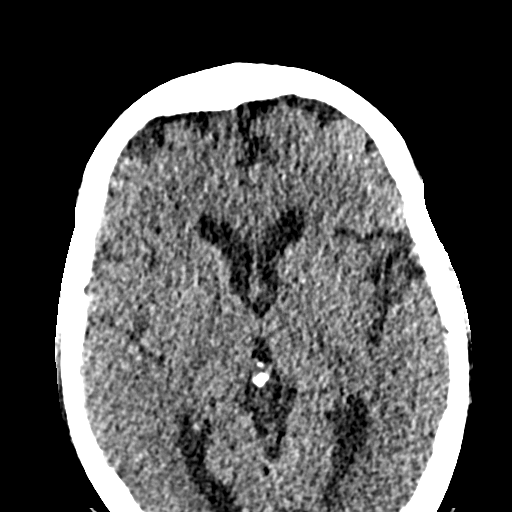
[im 77/83  bone]
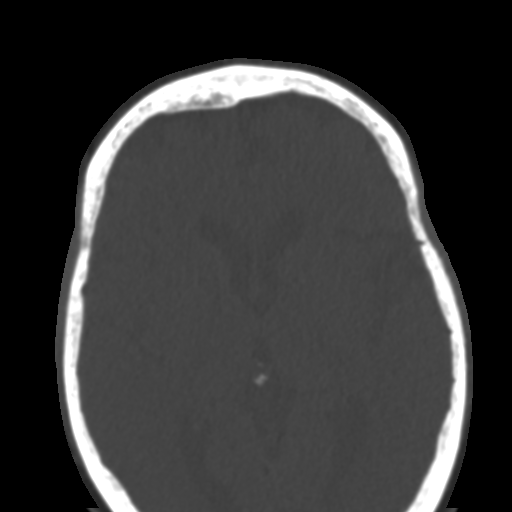

[Series 6: coronal soft · coronal · 0.33mm/px · 3 of 62 slices shown]
[im 21/62  bone]
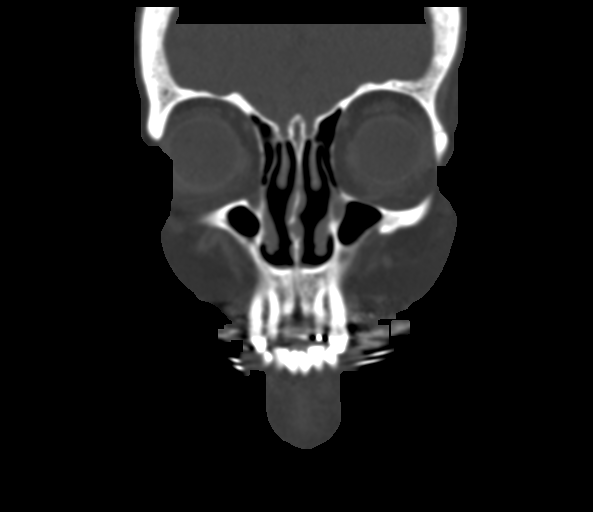
[im 28/62  bone]
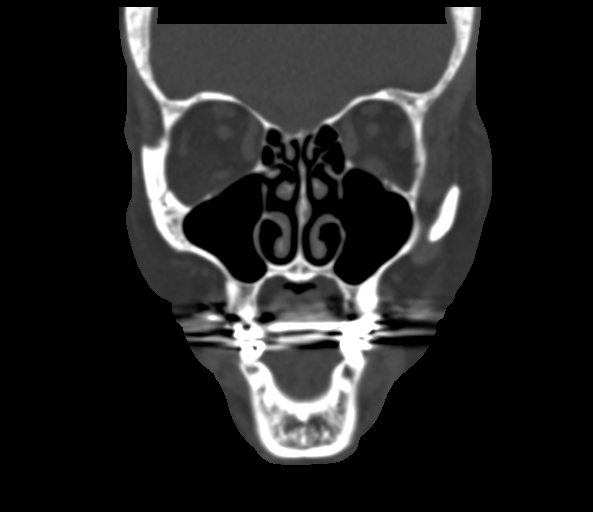
[im 34/62  bone]
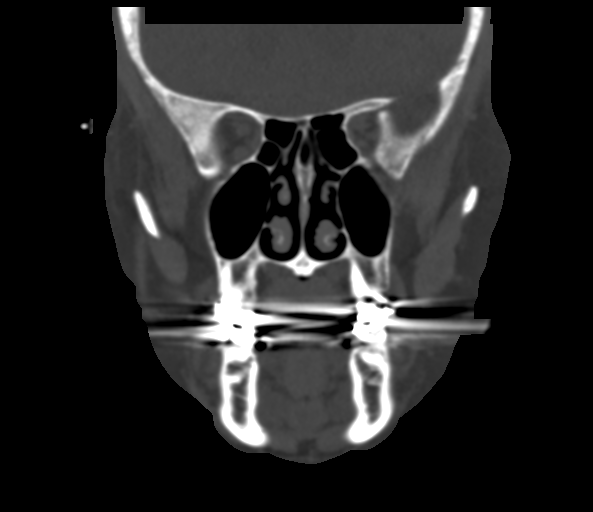

[Series 7: sagittal soft · sagittal · 0.31mm/px · 3 of 85 slices shown]
[im 29/85  bone]
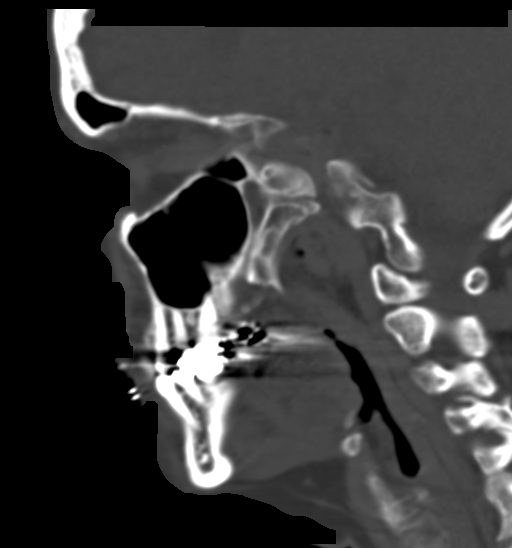
[im 43/85  bone]
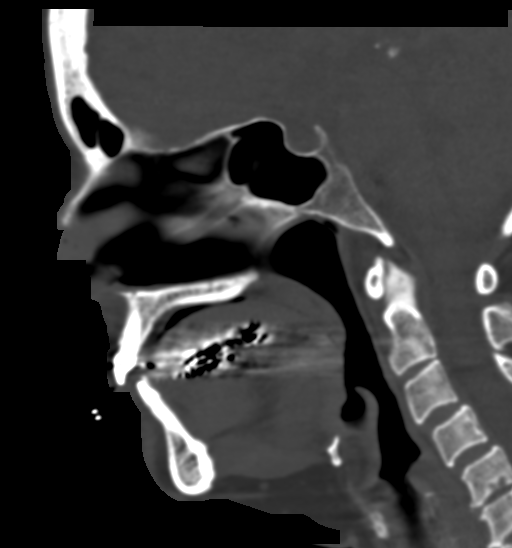
[im 57/85  bone]
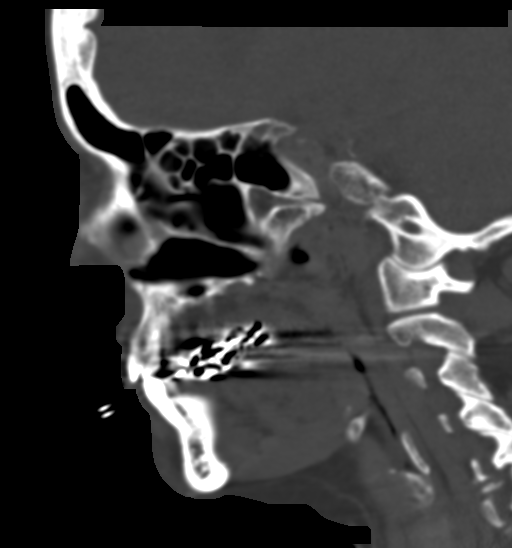

[15 of 47 positions shown; findings below may reference images not displayed]

FINDINGS: CT HEAD FINDINGS

Brain: No evidence of acute infarction, hemorrhage, hydrocephalus,
extra-axial collection or mass lesion/mass effect. Small arachnoid
cyst in the left middle cranial fossa is unchanged.

Vascular: No hyperdense vessel or unexpected calcification.

Skull: Normal. Negative for fracture or focal lesion.

Other: None.

CT MAXILLOFACIAL FINDINGS

Osseous: No fracture or mandibular dislocation. No destructive
process.

Orbits: Status post cataract surgery.  Otherwise negative.

Sinuses: Clear.

Soft tissues: Negative

CT CERVICAL SPINE FINDINGS

Alignment: Facet degenerative change results in trace
anterolisthesis C4 on C5. Straightening of lordosis incidentally
noted.

Skull base and vertebrae: No acute fracture. No primary bone lesion
or focal pathologic process.

Soft tissues and spinal canal: No prevertebral fluid or swelling. No
visible canal hematoma.

Disc levels: There is loss of disc space height at C5-6 and C6-7.
Scattered facet arthropathy appears worst on the left at C3-4 and
C4-5.

Upper chest: Clear.

Other: None.
IMPRESSION: No acute abnormality head, face or cervical spine.

Small, benign arachnoid cyst left middle cranial fossa is unchanged.

Mid cervical degenerative disease.

## 2020-06-11 IMAGING — CT CT HEAD W/O CM
4 of 5 series · 14 of 47 positions shown, 16 images · non-contrast
Comparison: Brain MRI 01/31/2017.

CLINICAL DATA: Syncope and fall today with a blow to the face.
Initial encounter.

EXAM:
CT HEAD WITHOUT CONTRAST
CT MAXILLOFACIAL WITHOUT CONTRAST
CT CERVICAL SPINE WITHOUT CONTRAST
TECHNIQUE: Multidetector CT imaging of the head, cervical spine, and
maxillofacial structures were performed using the standard protocol
without intravenous contrast. Multiplanar CT image reconstructions
of the cervical spine and maxillofacial structures were also
generated.

[Series 2: head w o · axial · 0.43mm/px · z∈[+1659,+1719]mm · 3 of 32 slices shown]
[im 7/32  brain]
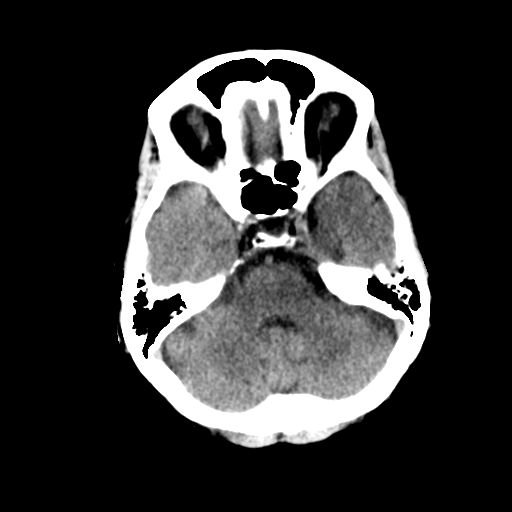
[im 13/32  brain]
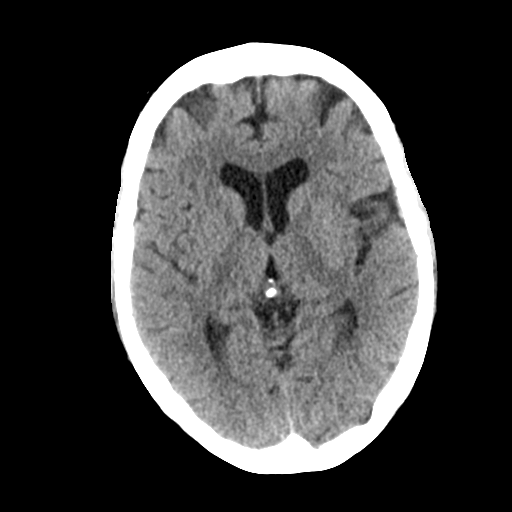
[im 19/32  brain]
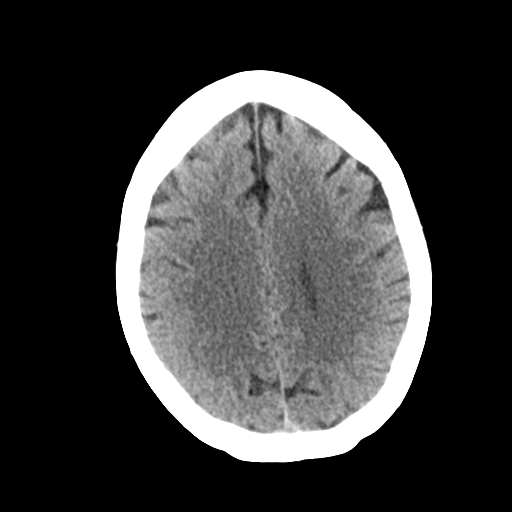

[Series 4: coronal soft · coronal · 0.30mm/px · 3 of 66 slices shown]
[im 22/66  brain]
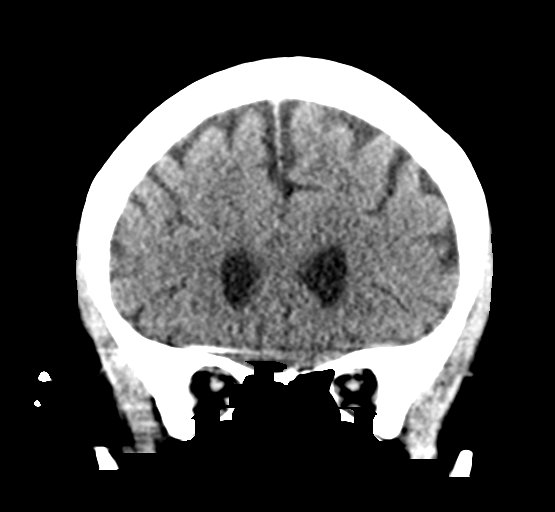
[im 29/66  brain]
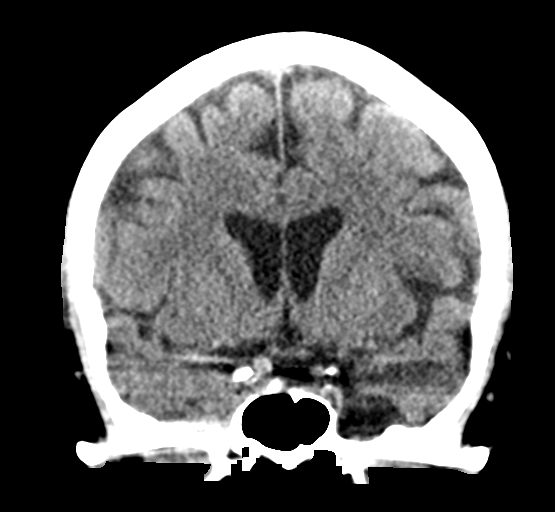
[im 37/66  brain]
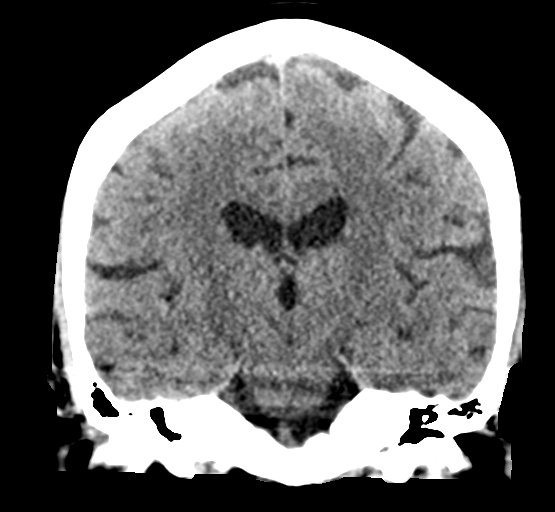

[Series 5: sagittal soft · sagittal · 0.32mm/px · 3 of 56 slices shown]
[im 19/56  brain]
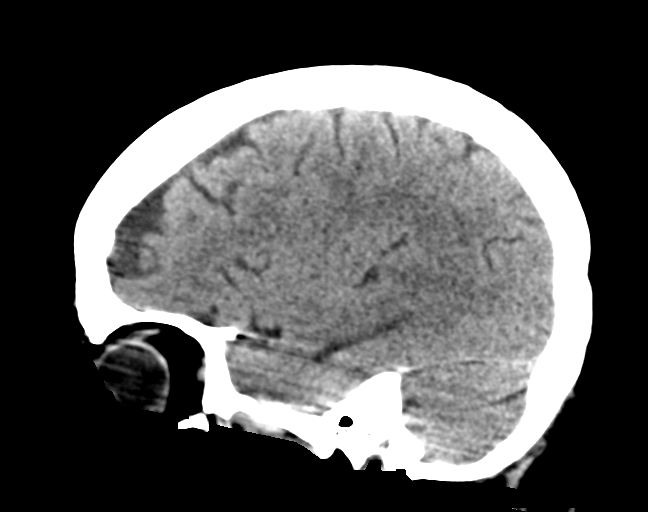
[im 28/56  brain]
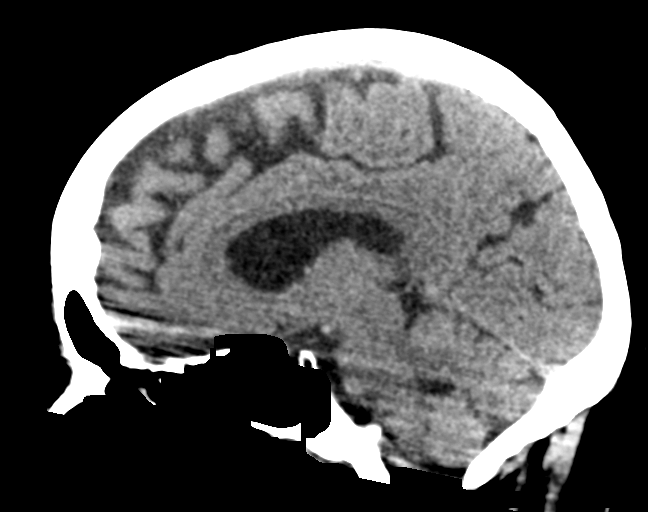
[im 37/56  brain]
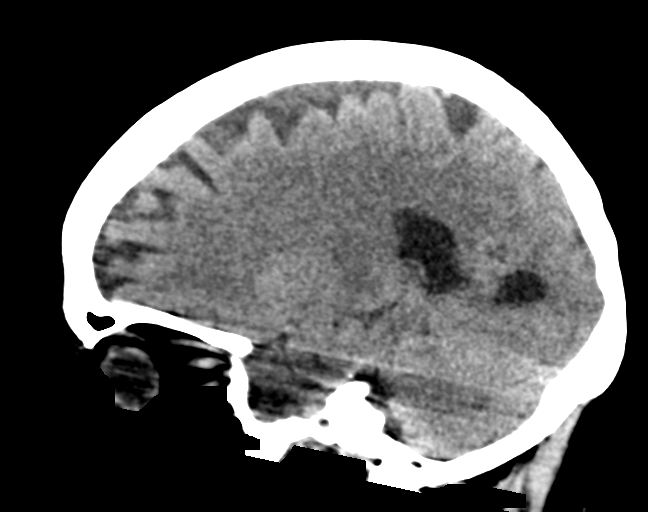

[Series 6: ax head w o · axial · 0.33mm/px · z∈[+1633,+1733]mm · 5 of 31 slices shown, 7 images]
[im 6/31  brain]
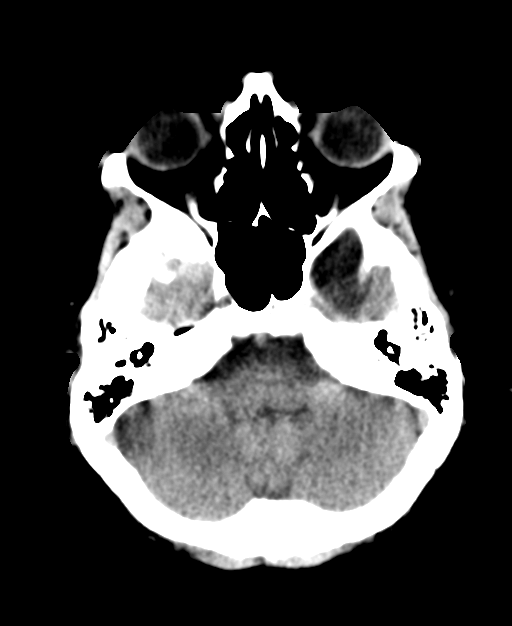
[im 6/31  bone]
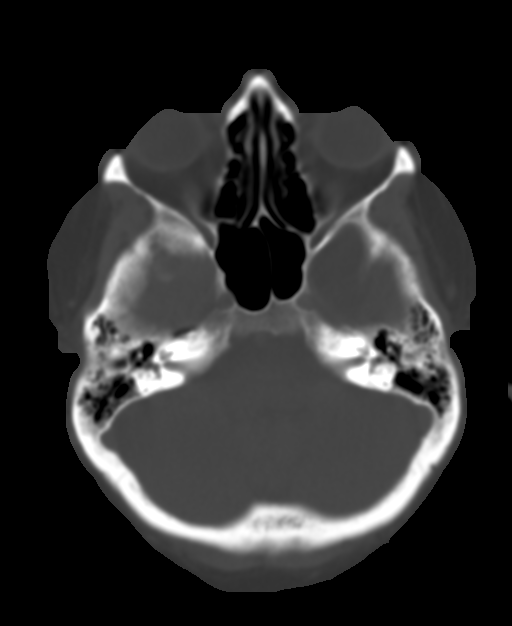
[im 11/31  brain]
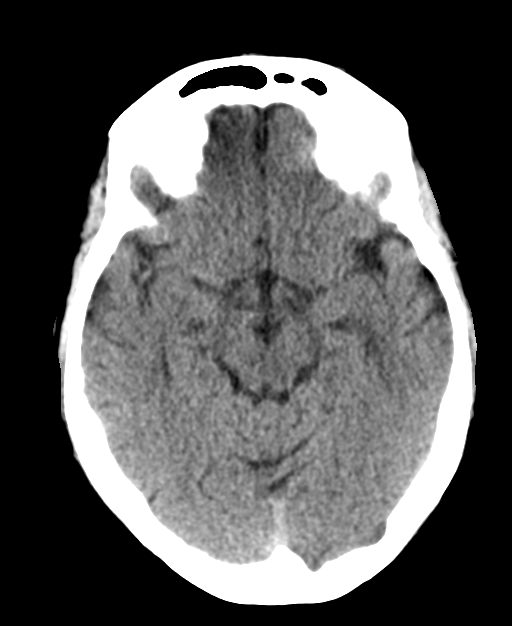
[im 16/31  brain]
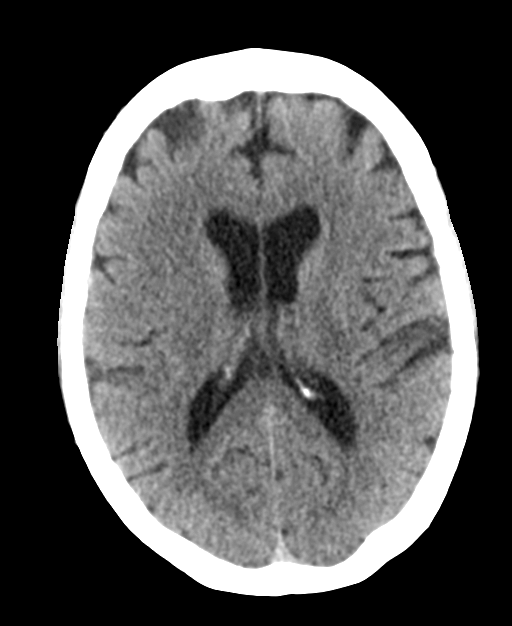
[im 21/31  brain]
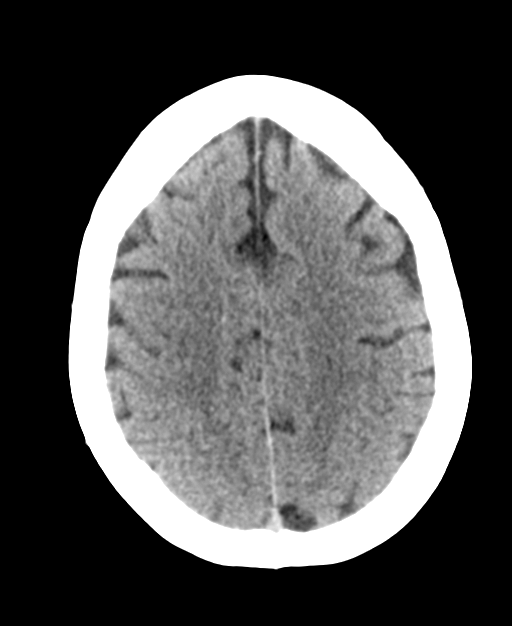
[im 26/31  brain]
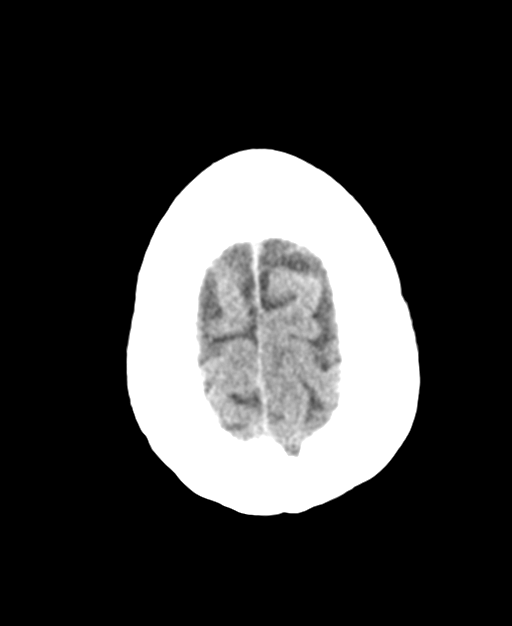
[im 26/31  bone]
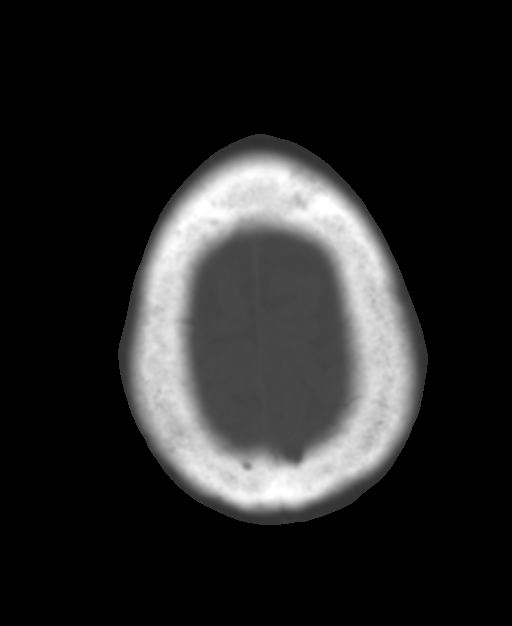

[14 of 47 positions shown; findings below may reference images not displayed]

FINDINGS: CT HEAD FINDINGS

Brain: No evidence of acute infarction, hemorrhage, hydrocephalus,
extra-axial collection or mass lesion/mass effect. Small arachnoid
cyst in the left middle cranial fossa is unchanged.

Vascular: No hyperdense vessel or unexpected calcification.

Skull: Normal. Negative for fracture or focal lesion.

Other: None.

CT MAXILLOFACIAL FINDINGS

Osseous: No fracture or mandibular dislocation. No destructive
process.

Orbits: Status post cataract surgery.  Otherwise negative.

Sinuses: Clear.

Soft tissues: Negative

CT CERVICAL SPINE FINDINGS

Alignment: Facet degenerative change results in trace
anterolisthesis C4 on C5. Straightening of lordosis incidentally
noted.

Skull base and vertebrae: No acute fracture. No primary bone lesion
or focal pathologic process.

Soft tissues and spinal canal: No prevertebral fluid or swelling. No
visible canal hematoma.

Disc levels: There is loss of disc space height at C5-6 and C6-7.
Scattered facet arthropathy appears worst on the left at C3-4 and
C4-5.

Upper chest: Clear.

Other: None.
IMPRESSION: No acute abnormality head, face or cervical spine.

Small, benign arachnoid cyst left middle cranial fossa is unchanged.

Mid cervical degenerative disease.

## 2020-06-29 LAB — CUP PACEART REMOTE DEVICE CHECK
Date Time Interrogation Session: 20220402004731
Implantable Pulse Generator Implant Date: 20200928

## 2020-07-01 ENCOUNTER — Ambulatory Visit (INDEPENDENT_AMBULATORY_CARE_PROVIDER_SITE_OTHER): Payer: Medicare Other

## 2020-07-01 DIAGNOSIS — I480 Paroxysmal atrial fibrillation: Secondary | ICD-10-CM | POA: Diagnosis not present

## 2020-07-03 ENCOUNTER — Ambulatory Visit: Payer: Medicare Other | Admitting: Podiatry

## 2020-07-03 ENCOUNTER — Other Ambulatory Visit: Payer: Self-pay

## 2020-07-03 ENCOUNTER — Encounter: Payer: Self-pay | Admitting: Podiatry

## 2020-07-03 DIAGNOSIS — M7661 Achilles tendinitis, right leg: Secondary | ICD-10-CM | POA: Diagnosis not present

## 2020-07-03 NOTE — Progress Notes (Signed)
Subjective:   Patient ID: Paige Jones, female   DOB: 70 y.o.   MRN: [id number]   HPI Patient states the left heel and Achilles are doing much better but the right has been very sore and hurts in the entire Achilles tendon near its insertion calcaneus   ROS      Objective:  Physical Exam  Neurovascular status intact negative Bevelyn Buckles' sign noted with patient having pain that is now more in the mid substance of the Achilles as the muscle becomes tendon proximal to the insertion calcaneus with pain still in the insertional point but not to the same degree     Assessment:  Severe Achilles tendinitis right worsening at this point     Plan:  H&P reviewed condition placed on oral anti-inflammatory and went ahead today and did apply an air fracture walker to completely immobilize and take all stress off the posterior heel and may consider physical therapy or other treatment with possible MRI if symptoms persist

## 2020-07-16 NOTE — Progress Notes (Signed)
Carelink Summary Report / Loop Recorder 

## 2020-07-24 ENCOUNTER — Other Ambulatory Visit: Payer: Self-pay

## 2020-07-24 ENCOUNTER — Ambulatory Visit: Payer: Medicare Other | Admitting: Podiatry

## 2020-07-24 ENCOUNTER — Encounter: Payer: Self-pay | Admitting: Podiatry

## 2020-07-24 DIAGNOSIS — M7661 Achilles tendinitis, right leg: Secondary | ICD-10-CM | POA: Diagnosis not present

## 2020-07-24 MED ORDER — TRIAMCINOLONE ACETONIDE 10 MG/ML IJ SUSP
10.0000 mg | Freq: Once | INTRAMUSCULAR | Status: AC
Start: 2020-07-24 — End: 2020-07-24
  Administered 2020-07-24: 10 mg

## 2020-07-24 NOTE — Progress Notes (Signed)
Subjective:   Patient ID: Paige Jones, female   DOB: 70 y.o.   MRN: [id number]   HPI Patient presents stating she is improving mildly with the boot but still has 1 area of persistent pain that makes it hard for her to get back into her shoes or be active   ROS      Objective:  Physical Exam  Neurovascular status intact with the posterior heel doing well but on the medial side there is 1 small area of inflammation that is been stubborn remains and is still irritated at at the insertion on the medial side      Assessment:  Improvement Achilles tendinitis right with 1 area of inflammation     Plan:  Reviewed continued boot usage and begin to do 1 more careful injection even though I did explain again chances for rupture with this but she is willing to accept risk and will immobilize.  On the medial side I carefully injected with 2 mg dexamethasone Kenalog 5 mg Xylocaine staying away from the center lateral part of the tendon and boot was to be reapplied immediately.  Reappoint as symptoms indicate may require physical therapy or other treatments

## 2020-08-05 ENCOUNTER — Ambulatory Visit (INDEPENDENT_AMBULATORY_CARE_PROVIDER_SITE_OTHER): Payer: Medicare Other

## 2020-08-05 DIAGNOSIS — R55 Syncope and collapse: Secondary | ICD-10-CM | POA: Diagnosis not present

## 2020-08-06 LAB — CUP PACEART REMOTE DEVICE CHECK
Date Time Interrogation Session: 20220514231638
Implantable Pulse Generator Implant Date: 20200928

## 2020-08-21 ENCOUNTER — Encounter: Payer: Self-pay | Admitting: Podiatry

## 2020-08-21 ENCOUNTER — Ambulatory Visit: Payer: Medicare Other | Admitting: Podiatry

## 2020-08-21 ENCOUNTER — Other Ambulatory Visit: Payer: Self-pay

## 2020-08-21 DIAGNOSIS — M7661 Achilles tendinitis, right leg: Secondary | ICD-10-CM | POA: Diagnosis not present

## 2020-08-22 NOTE — Progress Notes (Signed)
Subjective:   Patient ID: Paige Jones, female   DOB: 70 y.o.   MRN: [id number]   HPI Patient presents stating she is improved with mild discomfort still noted   ROS      Objective:  Physical Exam  Neurovascular status intact moderate posterior pain Achilles tendon right at insertion calcaneus localized no indications of tendon dysfunction     Assessment:  Achilles tendinitis right present with moderate improvement     Plan:  H&P reviewed different treatment she can do at home including shoe gear modification stretching exercises ice therapy topical medicines oral medicines.  At this point we are going to hold off any more aggressive treatment even though I did explain continue treatments we could consider and patient will be seen back as needed

## 2020-08-28 NOTE — Progress Notes (Signed)
Carelink Summary Report / Loop Recorder 

## 2020-09-09 ENCOUNTER — Ambulatory Visit (INDEPENDENT_AMBULATORY_CARE_PROVIDER_SITE_OTHER): Payer: Medicare Other

## 2020-09-09 DIAGNOSIS — R55 Syncope and collapse: Secondary | ICD-10-CM

## 2020-09-09 LAB — CUP PACEART REMOTE DEVICE CHECK
Date Time Interrogation Session: 20220614233203
Implantable Pulse Generator Implant Date: 20200928

## 2020-09-30 ENCOUNTER — Ambulatory Visit: Payer: Medicare Other | Admitting: Nurse Practitioner

## 2020-09-30 NOTE — Progress Notes (Signed)
Carelink Summary Report / Loop Recorder 

## 2020-10-10 LAB — CUP PACEART REMOTE DEVICE CHECK
Date Time Interrogation Session: 20220716002712
Implantable Pulse Generator Implant Date: 20200928

## 2020-10-14 ENCOUNTER — Ambulatory Visit (INDEPENDENT_AMBULATORY_CARE_PROVIDER_SITE_OTHER): Payer: Medicare Other

## 2020-10-14 DIAGNOSIS — R55 Syncope and collapse: Secondary | ICD-10-CM

## 2020-10-23 ENCOUNTER — Other Ambulatory Visit: Payer: Self-pay

## 2020-10-23 ENCOUNTER — Ambulatory Visit (INDEPENDENT_AMBULATORY_CARE_PROVIDER_SITE_OTHER): Payer: Medicare Other | Admitting: Internal Medicine

## 2020-10-23 VITALS — BP 134/70 | HR 74 | Ht >= 80 in | Wt 153.8 lb

## 2020-10-23 DIAGNOSIS — E785 Hyperlipidemia, unspecified: Secondary | ICD-10-CM | POA: Diagnosis not present

## 2020-10-23 DIAGNOSIS — I10 Essential (primary) hypertension: Secondary | ICD-10-CM

## 2020-10-23 DIAGNOSIS — I480 Paroxysmal atrial fibrillation: Secondary | ICD-10-CM | POA: Diagnosis not present

## 2020-10-23 DIAGNOSIS — R55 Syncope and collapse: Secondary | ICD-10-CM | POA: Diagnosis not present

## 2020-10-23 NOTE — Progress Notes (Signed)
PCP: Chesley Noon, MD   Primary EP: Dr Adaline Sill is a 70 y.o. female who presents today for routine electrophysiology followup.  Since last being seen in our clinic, the patient reports doing very well.  Today, she denies symptoms of palpitations, chest pain, shortness of breath,  lower extremity edema, dizziness, presyncope, or syncope.   Her primary concern is with L shoulder pain.  This has persisted since lifting a heavy water jug to water her flowers 6 weeks ago.  She has a scheduled appointment with ortho.  The patient is otherwise without complaint today.   Past Medical History:  Diagnosis Date   Arachnoid cyst    Arthritis    Basal cell carcinoma    Hyperlipidemia    Hypertension    Paroxysmal atrial fibrillation (HCC)    Thyroid goiter    Past Surgical History:  Procedure Laterality Date   ATRIAL FIBRILLATION ABLATION N/A 08/30/2018   Procedure: ATRIAL FIBRILLATION ABLATION;  Surgeon: Thompson Grayer, MD;  Location: [place] [facility];  Service: Cardiovascular;  Laterality: N/A;   BASAL CELL CARCINOMA EXCISION     HYSTERECTOMY ABDOMINAL WITH SALPINGECTOMY     implantable loop recorder implantation  12/19/2018    Medtronic Reveal Linq model LNQ11 ([place] [id number])  implantable loop recorder implanted by Dr [name] in office for afib management post ablation   TONSILLECTOMY AND ADENOIDECTOMY      ROS- all systems are reviewed and negatives except as per HPI above  Current Outpatient Medications  Medication Sig Dispense Refill   atorvastatin (LIPITOR) 20 MG tablet TAKE ONE TABLET BY MOUTH ONCE DAILY. 30 tablet 6   azelastine (ASTELIN) 0.1 % nasal spray Place 1 spray into the nose 2 (two) times daily as needed for allergies.      calcium carbonate (OS-CAL) 600 MG TABS tablet Take 600 mg by mouth daily.      cetirizine (ZYRTEC) 10 MG tablet Take 10 mg by mouth at bedtime as needed for allergies.     diclofenac (VOLTAREN) 75 MG EC tablet TAKE (1) TABLET BY  MOUTH TWICE DAILY. 180 tablet 0   diclofenac sodium (VOLTAREN) 1 % GEL Apply 4 g topically 4 (four) times daily. 100 g 3   estradiol (ESTRACE) 0.1 MG/GM vaginal cream Place 1 Applicatorful vaginally as directed.     estradiol (VIVELLE-DOT) 0.025 MG/24HR Place 1 patch onto the skin 2 (two) times a week.     fenofibrate 160 MG tablet TAKE ONE TABLET BY MOUTH ONCE DAILY. 90 tablet 0   fluticasone (FLONASE) 50 MCG/ACT nasal spray Place 2 sprays into both nostrils as needed for allergies.      furosemide (LASIX) 20 MG tablet Take 20 mg by mouth as needed for fluid.     ibuprofen (ADVIL) 200 MG tablet Take 400 mg by mouth every 6 (six) hours as needed for headache or moderate pain.     levothyroxine (SYNTHROID) 88 MCG tablet Take 88 mcg by mouth daily.      Molnupiravir 200 MG CAPS TAKE 4 CAPSULES BY MOUTH 2 TIMES DAILY FOR 5 DAYS 40 capsule 0   Multiple Vitamins-Minerals (CENTRUM SILVER 50+WOMEN) TABS Take by mouth.     Omega-3 Fatty Acids (FISH OIL) 1000 MG CAPS Take 1,000 mg by mouth daily.      omeprazole (PRILOSEC) 20 MG capsule TAKE (1) CAPSULE BY MOUTH ONCE DAILY. 90 capsule 0   ondansetron (ZOFRAN) 4 MG tablet Take 1 tablet (4 mg total) by  mouth every 8 (eight) hours as needed for nausea or vomiting. 20 tablet 0   TURMERIC PO Take 550 mg by mouth daily.     vitamin E 180 MG (400 UNITS) capsule Take 400 Units by mouth daily.      No current facility-administered medications for this visit.    Physical Exam: Vitals:   10/23/20 1414  BP: 134/70  Pulse: 74  SpO2: 96%  Weight: 153 lb 12.8 oz (69.8 kg)  Height: 7' (2.134 m)  HC: 62" (157.5 cm)    GEN- The patient is well appearing, alert and oriented x 3 today.   Head- normocephalic, atraumatic Eyes-  Sclera clear, conjunctiva pink Ears- hearing intact Oropharynx- clear Lungs- Clear to ausculation bilaterally, normal work of breathing Heart- Regular rate and rhythm, no murmurs, rubs or gallops, PMI not laterally displaced GI- soft,  NT, ND, + BS Extremities- no clubbing, cyanosis, or edema  Wt Readings from Last 3 Encounters:  10/23/20 153 lb 12.8 oz (69.8 kg)  09/27/19 154 lb (69.9 kg)  08/03/19 155 lb (70.3 kg)    EKG tracing ordered today is personally reviewed and shows sinus  Assessment and Plan:  Paroxysmal atrial fibrillation Well controlled post ablation. No afib by ILR Chads2vasc score is 3.  We will consider return to [facility] if her AF returns by ILR  2. HTN Controlled off medicine  3. HL Lipitor '20mg'$  daily   Risks, benefits and potential toxicities for medications prescribed and/or refilled reviewed with patient today.   Return in a year  Thompson Grayer MD, [facility] 10/23/2020 2:29 PM

## 2020-10-23 NOTE — Patient Instructions (Signed)
Medication Instructions:  Your physician recommends that you continue on your current medications as directed. Please refer to the Current Medication list given to you today.  *If you need a refill on your cardiac medications before your next appointment, please call your pharmacy*   Lab Work: None ordered.  If you have labs (blood work) drawn today and your tests are completely normal, you will receive your results only by: [place] (if you have MyChart) OR A paper copy in the mail If you have any lab test that is abnormal or we need to change your treatment, we will call you to review the results.   Testing/Procedures: None ordered.    Follow-Up: At [facility], you and your health needs are our priority.  As part of our continuing mission to provide you with exceptional heart care, we have created designated Provider Care Teams.  These Care Teams include your primary Cardiologist (physician) and Advanced Practice Providers (APPs -  Physician Assistants and Nurse Practitioners) who all work together to provide you with the care you need, when you need it.  We recommend signing up for the patient portal called "MyChart".  Sign up information is provided on this After Visit Summary.  MyChart is used to connect with patients for Virtual Visits (Telemedicine).  Patients are able to view lab/test results, encounter notes, upcoming appointments, etc.  Non-urgent messages can be sent to your provider as well.   To learn more about what you can do with MyChart, go to [email or web address].    Your next appointment:   12 month(s)  The format for your next appointment:   In Person  Provider:   Thompson Grayer, MD

## 2020-11-05 ENCOUNTER — Ambulatory Visit (INDEPENDENT_AMBULATORY_CARE_PROVIDER_SITE_OTHER): Payer: Medicare Other

## 2020-11-05 DIAGNOSIS — I480 Paroxysmal atrial fibrillation: Secondary | ICD-10-CM | POA: Diagnosis not present

## 2020-11-05 LAB — CUP PACEART REMOTE DEVICE CHECK
Date Time Interrogation Session: 20220816005739
Implantable Pulse Generator Implant Date: 20200928

## 2020-11-08 NOTE — Progress Notes (Signed)
Carelink Summary Report / Loop Recorder 

## 2020-11-26 NOTE — Progress Notes (Signed)
Carelink Summary Report / Loop Recorder 

## 2020-12-09 ENCOUNTER — Ambulatory Visit (INDEPENDENT_AMBULATORY_CARE_PROVIDER_SITE_OTHER): Payer: Medicare Other

## 2020-12-09 DIAGNOSIS — I480 Paroxysmal atrial fibrillation: Secondary | ICD-10-CM | POA: Diagnosis not present

## 2020-12-10 LAB — CUP PACEART REMOTE DEVICE CHECK
Date Time Interrogation Session: 20220916011255
Implantable Pulse Generator Implant Date: 20200928

## 2020-12-16 NOTE — Progress Notes (Signed)
Carelink Summary Report / Loop Recorder 

## 2021-01-13 ENCOUNTER — Ambulatory Visit (INDEPENDENT_AMBULATORY_CARE_PROVIDER_SITE_OTHER): Payer: Medicare Other

## 2021-01-13 DIAGNOSIS — I480 Paroxysmal atrial fibrillation: Secondary | ICD-10-CM | POA: Diagnosis not present

## 2021-01-13 LAB — CUP PACEART REMOTE DEVICE CHECK
Date Time Interrogation Session: 20221017011703
Implantable Pulse Generator Implant Date: 20200928

## 2021-01-21 NOTE — Progress Notes (Signed)
Carelink Summary Report / Loop Recorder 

## 2021-02-15 LAB — CUP PACEART REMOTE DEVICE CHECK
Date Time Interrogation Session: 20221117001905
Implantable Pulse Generator Implant Date: 20200928

## 2021-02-17 ENCOUNTER — Ambulatory Visit (INDEPENDENT_AMBULATORY_CARE_PROVIDER_SITE_OTHER): Payer: Medicare Other

## 2021-02-17 DIAGNOSIS — I480 Paroxysmal atrial fibrillation: Secondary | ICD-10-CM | POA: Diagnosis not present

## 2021-02-26 NOTE — Progress Notes (Signed)
Carelink Summary Report / Loop Recorder 

## 2021-03-10 ENCOUNTER — Ambulatory Visit (INDEPENDENT_AMBULATORY_CARE_PROVIDER_SITE_OTHER): Payer: Medicare Other

## 2021-03-10 DIAGNOSIS — I480 Paroxysmal atrial fibrillation: Secondary | ICD-10-CM

## 2021-03-11 LAB — CUP PACEART REMOTE DEVICE CHECK
Date Time Interrogation Session: 20221218002446
Implantable Pulse Generator Implant Date: 20200928

## 2021-03-20 NOTE — Progress Notes (Signed)
Carelink Summary Report / Loop Recorder 

## 2021-04-09 ENCOUNTER — Ambulatory Visit (INDEPENDENT_AMBULATORY_CARE_PROVIDER_SITE_OTHER): Payer: Medicare Other

## 2021-04-09 DIAGNOSIS — I480 Paroxysmal atrial fibrillation: Secondary | ICD-10-CM

## 2021-04-09 LAB — CUP PACEART REMOTE DEVICE CHECK
Date Time Interrogation Session: 20230118003649
Implantable Pulse Generator Implant Date: 20200928

## 2021-04-22 NOTE — Progress Notes (Signed)
Carelink Summary Report / Loop Recorder 

## 2021-05-12 ENCOUNTER — Ambulatory Visit (INDEPENDENT_AMBULATORY_CARE_PROVIDER_SITE_OTHER): Payer: Medicare Other

## 2021-05-12 DIAGNOSIS — I480 Paroxysmal atrial fibrillation: Secondary | ICD-10-CM | POA: Diagnosis not present

## 2021-05-20 NOTE — Progress Notes (Signed)
Carelink Summary Report / Loop Recorder 

## 2021-06-12 ENCOUNTER — Ambulatory Visit: Payer: Medicare Other | Admitting: Internal Medicine

## 2021-06-12 ENCOUNTER — Other Ambulatory Visit: Payer: Self-pay

## 2021-06-12 ENCOUNTER — Encounter: Payer: Self-pay | Admitting: Internal Medicine

## 2021-06-12 VITALS — BP 110/62 | HR 71 | Ht 62.0 in | Wt 163.0 lb

## 2021-06-12 DIAGNOSIS — D6869 Other thrombophilia: Secondary | ICD-10-CM

## 2021-06-12 DIAGNOSIS — I10 Essential (primary) hypertension: Secondary | ICD-10-CM | POA: Diagnosis not present

## 2021-06-12 DIAGNOSIS — I480 Paroxysmal atrial fibrillation: Secondary | ICD-10-CM

## 2021-06-12 HISTORY — PX: OTHER SURGICAL HISTORY: SHX169

## 2021-06-12 LAB — CUP PACEART REMOTE DEVICE CHECK
Date Time Interrogation Session: 20230219230820
Implantable Pulse Generator Implant Date: 20200928

## 2021-06-12 NOTE — Patient Instructions (Addendum)
Medication Instructions:  ?Your physician recommends that you continue on your current medications as directed. Please refer to the Current Medication list given to you today. ? ?Labwork: ?None ordered. ? ?Testing/Procedures: ?None ordered. ? ?Follow-Up: ?Your physician wants you to follow-up in: one year with Dr. [name].   ?You will receive a reminder letter in the mail two months in advance. If you don't receive a letter, please call our office to schedule the follow-up appointment. ? ?Implantable Loop Recorder Removal, Care After ?This sheet gives you information about how to care for yourself after your procedure. Your health care provider may also give you more specific instructions. If you have problems or questions, contact your health care provider. ?What can I expect after the procedure? ?After the procedure, it is common to have: ?Soreness or discomfort near the incision. ?Some swelling or bruising near the incision. ? ?Follow these instructions at home: ?Incision care ? ? Leave your outer dressing on for 24 hours.  After 24 hours you can remove your outer dressing and shower. ?Leave adhesive strips in place. These skin closures may need to stay in place for 1-2 weeks. If adhesive strip edges start to loosen and curl up, you may trim the loose edges.  You may remove the strips if they have not fallen off after 2 weeks. ?Check your incision area every day for signs of infection. Check for: ?Redness, swelling, or pain. ?Fluid or blood. ?Warmth. ?Pus or a bad smell. ?Do not take baths, swim, or use a hot tub until your incision is completely healed. ?If your wound site starts to bleed apply pressure.   ?   ?If you have any questions/concerns please call the device clinic at [phone]. ? ?Activity ? ?Return to your normal activities. ? ?Contact a health care provider if: ?You have redness, swelling, or pain around your incision. ?You have a fever. ? ?

## 2021-06-12 NOTE — Progress Notes (Signed)
? ?PCP: Chesley Noon, MD ?  ?Primary EP: Dr [name] ? ?ISAURA Jones is a 71 y.o. female who presents today for routine electrophysiology followup.  Since last being seen in our clinic, the patient reports doing very well.  Today, she denies symptoms of palpitations, chest pain, shortness of breath,  lower extremity edema, dizziness, presyncope, or syncope.  The patient is otherwise without complaint today.  ? ?Past Medical History:  ?Diagnosis Date  ? Arachnoid cyst   ? Arthritis   ? Basal cell carcinoma   ? Hyperlipidemia   ? Hypertension   ? Paroxysmal atrial fibrillation (HCC)   ? Thyroid goiter   ? ?Past Surgical History:  ?Procedure Laterality Date  ? ATRIAL FIBRILLATION ABLATION N/A 08/30/2018  ? Procedure: ATRIAL FIBRILLATION ABLATION;  Surgeon: Thompson Grayer, MD;  Location: [place] [facility];  Service: Cardiovascular;  Laterality: N/A;  ? BASAL CELL CARCINOMA EXCISION    ? HYSTERECTOMY ABDOMINAL WITH SALPINGECTOMY    ? implantable loop recorder implantation  12/19/2018  ?  Medtronic Reveal Landmark model [id number] ([place] [id number])  implantable loop recorder implanted by Dr [name] in office for afib management post ablation  ? TONSILLECTOMY AND ADENOIDECTOMY    ? ? ?ROS- all systems are reviewed and negatives except as per HPI above ? ?Current Outpatient Medications  ?Medication Sig Dispense Refill  ? acetaminophen (TYLENOL) 500 MG tablet Take 500 mg by mouth every 6 (six) hours as needed for mild pain.    ? atorvastatin (LIPITOR) 20 MG tablet TAKE ONE TABLET BY MOUTH ONCE DAILY. 30 tablet 6  ? azelastine (ASTELIN) 0.1 % nasal spray Place 1 spray into the nose 2 (two) times daily as needed for allergies.     ? calcium carbonate (OS-CAL) 600 MG TABS tablet Take 600 mg by mouth daily.     ? cetirizine (ZYRTEC) 10 MG tablet Take 10 mg by mouth at bedtime as needed for allergies.    ? diclofenac (VOLTAREN) 75 MG EC tablet TAKE (1) TABLET BY MOUTH TWICE DAILY. 180 tablet 0  ? diclofenac sodium (VOLTAREN) 1 % GEL  Apply 4 g topically 4 (four) times daily. 100 g 3  ? estradiol (VIVELLE-DOT) 0.025 MG/24HR Place 1 patch onto the skin 2 (two) times a week.    ? fenofibrate 160 MG tablet TAKE ONE TABLET BY MOUTH ONCE DAILY. 90 tablet 0  ? fluticasone (FLONASE) 50 MCG/ACT nasal spray Place 2 sprays into both nostrils as needed for allergies.     ? furosemide (LASIX) 20 MG tablet Take 20 mg by mouth as needed for fluid.    ? levothyroxine (SYNTHROID) 88 MCG tablet Take 88 mcg by mouth daily.     ? Multiple Vitamins-Minerals (CENTRUM SILVER 50+WOMEN) TABS Take by mouth.    ? Omega-3 Fatty Acids (FISH OIL) 1000 MG CAPS Take 1,000 mg by mouth daily.     ? omeprazole (PRILOSEC) 20 MG capsule TAKE (1) CAPSULE BY MOUTH ONCE DAILY. 90 capsule 0  ? TURMERIC PO Take 550 mg by mouth daily.    ? vitamin B-12 (CYANOCOBALAMIN) 500 MCG tablet Take 500 mcg by mouth daily.    ? vitamin E 180 MG (400 UNITS) capsule Take 400 Units by mouth daily.     ? ?No current facility-administered medications for this visit.  ? ? ?Physical Exam: ?Vitals:  ? 06/12/21 1548  ?BP: 110/62  ?Pulse: 71  ?SpO2: 98%  ?Weight: 163 lb (73.9 kg)  ?Height: '5\' 2"'$  (1.575 m)  ? ? ?  GEN- The patient is well appearing, alert and oriented x 3 today.   ?Head- normocephalic, atraumatic ?Eyes-  Sclera clear, conjunctiva pink ?Ears- hearing intact ?Oropharynx- clear ?Lungs- Clear to ausculation bilaterally, normal work of breathing ?Heart- Regular rate and rhythm, no murmurs, rubs or gallops, PMI not laterally displaced ?GI- soft, NT, ND, + BS ?Extremities- no clubbing, cyanosis, or edema ? ?Wt Readings from Last 3 Encounters:  ?06/12/21 163 lb (73.9 kg)  ?10/23/20 153 lb 12.8 oz (69.8 kg)  ?09/27/19 154 lb (69.9 kg)  ? ? ?EKG tracing ordered today is personally reviewed and shows sinus ? ?Assessment and Plan: ? ?Paroxysmal atrial fibrillation ?Well controlled post ablation ?No AF by ILR ?Chads2vasc score is 3.  She will consider [facility] if her AF returns ?Her ILR is approaching ERI.  She  wishes to have this removed.  Risks and benefits to ILR removal were discussed at length with the patient including but not limited to bleeding and infection.  She understands risks and wishes to proceed. ? ?2. HTN ?Stable ?No change required today ?  ? ?Thompson Grayer MD, [facility] ?06/12/2021 ?3:58 PM ?  ?   ? PROCEDURES:  ? 1. Implantable loop recorder explantation ?     ?   ?DESCRIPTION OF PROCEDURE:  Informed written consent was obtained.  The patient required no sedation for the procedure today.   The patients left chest was therefore prepped and draped in the usual sterile fashion.  The skin overlying the ILR monitor was infiltrated with lidocaine for local analgesia.  A 0.5-cm incision was made over the site.  The previously implanted ILR was exposed and removed using a combination of sharp and blunt dissection.  Steri- Strips and a sterile dressing were then applied. EBL<50m.  There were no early apparent complications.  ?   ?CONCLUSIONS:  ? 1. Successful explantation of a Medtronic Reveal LINQ implantable loop recorder  ? 2. No early apparent complications.  ?   ?Return in a year ?   ?JThompson GrayerMD, [facility]?06/12/2021 ?4:10 PM ? ? ? ? ? ? ?

## 2021-07-22 ENCOUNTER — Other Ambulatory Visit: Payer: Self-pay | Admitting: Endocrinology

## 2021-07-22 ENCOUNTER — Other Ambulatory Visit: Payer: Medicare Other

## 2021-07-22 DIAGNOSIS — E041 Nontoxic single thyroid nodule: Secondary | ICD-10-CM

## 2021-07-23 ENCOUNTER — Ambulatory Visit
Admission: RE | Admit: 2021-07-23 | Discharge: 2021-07-23 | Disposition: A | Discharge status: Home / Self Care | Payer: Medicare Other | Source: Ambulatory Visit | Attending: Endocrinology | Admitting: Endocrinology

## 2021-07-23 DIAGNOSIS — E041 Nontoxic single thyroid nodule: Secondary | ICD-10-CM

## 2022-03-19 ENCOUNTER — Telehealth: Payer: Self-pay | Admitting: Cardiovascular Disease

## 2022-03-19 NOTE — Telephone Encounter (Signed)
Patient c/o Palpitations:  High priority if patient c/o lightheadedness, shortness of breath, or chest pain  How long have you had palpitations/irregular HR/ Afib? Are you having the symptoms now?  Palpitations at night 2-3 times weekly for 6 months, per Joni Reining, PA of [place]. Patient is hopeful to get patient worked in for EP follow up ASAP.  Are you currently experiencing lightheadedness, SOB or CP?   Do you have a history of afib (atrial fibrillation) or irregular heart rhythm?  Patient has hx of afib, per Clarise Cruz, [place]  Have you checked your BP or HR? (document readings if available):  124/78 100  Are you experiencing any other symptoms?  No

## 2022-03-19 NOTE — Telephone Encounter (Signed)
Spoke with the patient's PCP who states that the patient has been having frequent palpitations mainly at night time. She is a former patient of Dr. Jackalyn Lombard and had an ablation in 2020. She is scheduled for follow up with Dr. Myles Gip in May however the patient's PCP is concerned that she is going in and out AFIB, although in NSR today. I have scheduled the patient for an appointment with Renee next week.

## 2022-03-26 NOTE — Progress Notes (Signed)
Cardiology Office Note Date:  03/27/2022  Patient ID:  Paige Jones, Paige Jones 1950-09-06, MRN [id number] PCP:  Chesley Noon, MD  Cardiologist:  None Electrophysiologist: Dr. Thompson Grayer, MD >> [name], MD (though has not seen yet)  Chief Complaint: palpitations  History of Present Illness: MALAVIKA Jones is a 72 y.o. female with PMH notable for parox Afib s/p ablation, HTN; seen today for [name], MD for acute visit due to increased palpitations.    Last saw Dr. [name] 05/2021, doing very well without palp, ILR at [facility] , so removed. Off [place].  She saw PCP for routine health maintence and mentioned an increasing palpitation burden over the last couple months. Our office was called by PCP office to request sooner f/up d/t increased palpitation and concern for afib. EKG done 03/19/2022 not available for review, but impression states "normal, no Afib" Today, patient confirms that she has had increased palpitations. They can happen as often as a couple times a week, always in the evenings. Sometimes goes many weeks without symptoms. Sometimes they seem to correlate with days where she has had increased caffeine or ETOH, but not always. These episodes do not feel same as her previous Afib episodes. In the past when she had Afib, she felt her heart racing very fast (150 or more).   Her derm started her on niacinamide to reduce basal cell carcinomas abotu 1-82yr ago. She googled it and found that it can cause palpitations and stopped taking it about 1 month ago.   she denies chest pain, palpitations, dyspnea, PND, orthopnea, nausea, vomiting, dizziness, syncope, edema, weight gain, or early satiety.     Device Information: ILR, removed 05/2021  AFib History: Diag 12/2015 Failed flecainide (ineffective) > multaq (effective, but dizziness/weakness) Afib ablation 08/2018  Past Medical History:  Diagnosis Date   Arachnoid cyst    Arthritis    Basal cell carcinoma     Hyperlipidemia    Hypertension    Paroxysmal atrial fibrillation ([place]    Thyroid goiter     Past Surgical History:  Procedure Laterality Date   ATRIAL FIBRILLATION ABLATION N/A 08/30/2018   Procedure: ATRIAL FIBRILLATION ABLATION;  Surgeon: AThompson Grayer MD;  [facility];  Service: Cardiovascular;  Laterality: N/A;   BASAL CELL CARCINOMA EXCISION     HYSTERECTOMY ABDOMINAL WITH SALPINGECTOMY     implantable loop recorder implantation  12/19/2018    [employer]tronic Reveal [place] LNQ11 (SN [id number])  implantable loop recorder implanted by Dr ARayann Hemanin office for afib management post ablation   implantable loop recorder removal  06/12/2021   MDT reveal LINQ removed by Dr ARayann Heman  TONSILLECTOMY AND ADENOIDECTOMY      Current Outpatient Medications  Medication Sig Dispense Refill   acetaminophen (TYLENOL) 500 MG tablet Take 500 mg by mouth every 6 (six) hours as needed for mild pain.     atorvastatin (LIPITOR) 20 MG tablet TAKE ONE TABLET BY MOUTH ONCE DAILY. 30 tablet 6   azelastine (ASTELIN) 0.1 % nasal spray Place 1 spray into the nose 2 (two) times daily as needed for allergies.      calcium carbonate (OS-CAL) 600 MG TABS tablet Take 600 mg by mouth daily.      cetirizine (ZYRTEC) 10 MG tablet Take 10 mg by mouth at bedtime as needed for allergies.     diclofenac (VOLTAREN) 75 MG EC tablet TAKE (1) TABLET BY MOUTH TWICE DAILY. 180 tablet 0   diclofenac  sodium (VOLTAREN) 1 % GEL Apply 4 g topically 4 (four) times daily. 100 g 3   estradiol (VIVELLE-DOT) 0.025 MG/24HR Place 1 patch onto the skin 2 (two) times a week.     fenofibrate 160 MG tablet TAKE ONE TABLET BY MOUTH ONCE DAILY. 90 tablet 0   fluticasone (FLONASE) 50 MCG/ACT nasal spray Place 2 sprays into both nostrils as needed for allergies.      furosemide (LASIX) 20 MG tablet Take 20 mg by mouth as needed for fluid.     levothyroxine (SYNTHROID) 88 MCG tablet Take 88 mcg by mouth daily.      Multiple  Vitamins-Minerals (CENTRUM SILVER 50+WOMEN) TABS Take by mouth.     Omega-3 Fatty Acids (FISH OIL) 1000 MG CAPS Take 1,000 mg by mouth daily.      omeprazole (PRILOSEC) 20 MG capsule TAKE (1) CAPSULE BY MOUTH ONCE DAILY. 90 capsule 0   TURMERIC PO Take 550 mg by mouth daily.     vitamin B-12 (CYANOCOBALAMIN) 500 MCG tablet Take 500 mcg by mouth daily.     vitamin E 180 MG (400 UNITS) capsule Take 400 Units by mouth daily.      No current facility-administered medications for this visit.    Allergies:   Melatonin, Pseudoephedrine, Rosuvastatin, Apixaban, Benadryl [diphenhydramine], Prednisone, and Rivaroxaban   Social History:  The patient  reports that she has never smoked. She has never used smokeless tobacco. She reports that she does not drink alcohol and does not use drugs.   Family History:  The patient's family history includes Arthritis in her mother; Atrial fibrillation in her mother; Cancer in her father; Diabetes in her father; Emphysema in her father; Fibromyalgia in her mother; Heart disease in her father; Thyroid disease in her mother.  ROS:  Please see the history of present illness. All other systems are reviewed and otherwise negative.   PHYSICAL EXAM:  VS:  BP 136/82   Pulse 78   Ht '5\' 2"'$  (1.575 m)   Wt 159 lb 3.2 oz (72.2 kg)   SpO2 97%   BMI 29.12 kg/m  BMI: Body mass index is 29.12 kg/m.  GEN- The patient is well appearing, alert and oriented x 3 today.   HEENT: normocephalic, atraumatic; sclera clear, conjunctiva pink; hearing intact; oropharynx clear; neck supple, no JVP Lungs- Clear to ausculation bilaterally, normal work of breathing.  No wheezes, rales, rhonchi Heart- Regular rate and rhythm, murmur loudest at R sternal border, no rubs or gallops, PMI not laterally displaced GI- soft, non-tender, non-distended, bowel sounds present, no hepatosplenomegaly Extremities- No peripheral edema. no clubbing or cyanosis; DP/PT/radial pulses 2+ bilaterally MS- no  significant deformity or atrophy Skin- warm and dry, no rash or lesion Psych- euthymic mood, full affect Neuro- strength and sensation are intact   EKG is ordered. Personal review of EKG from today shows:  NSR, rate 75bpm  Recent Labs: No results found for requested labs within last 365 days.  No results found for requested labs within last 365 days.   CrCl cannot be calculated (Patient's most recent lab result is older than the maximum 21 days allowed.).   Wt Readings from Last 3 Encounters:  03/27/22 159 lb 3.2 oz (72.2 kg)  06/12/21 163 lb (73.9 kg)  10/23/20 153 lb 12.8 oz (69.8 kg)     Additional studies reviewed include: Previous EP notes.   AF ablation 08/30/2018 CONCLUSIONS: 1. Sinus rhythm upon presentation.   2. Intracardiac echo reveals a moderate sized left atrium  with four separate pulmonary veins without evidence of pulmonary vein stenosis. 3. Successful electrical isolation and anatomical encircling of all four pulmonary veins with radiofrequency current. 4. No inducible arrhythmias following ablation both on and off of Isuprel 5. No early apparent complications.  TTE 08/17/2018  1. The left ventricle has normal systolic function with an ejection fraction of 60-65%. The cavity size was normal. Left ventricular diastolic parameters were normal.   2. The right ventricle has normal systolic function. The cavity was normal. There is no increase in right ventricular wall thickness.   3. Left atrial size was mildly dilated.   4. No evidence of mitral valve stenosis.   5. The aortic valve is tricuspid. Mild thickening of the aortic valve. Mild calcification of the aortic valve. No stenosis of the aortic valve. Mild aortic annular calcification noted.   6. The aortic root is normal in size and structure.   7. Pulmonary hypertension is indeterminate, inadequate TR jet.   8. The inferior vena cava was dilated in size with >50% respiratory variability.    ASSESSMENT AND  PLAN:  #) palpitations  Unclear cause of palp Stopped niacinamide 30-day monitor to assess rhythm/burden Encouraged her to make a diary of symptoms with episodes to determine potential triggers  #) h/o Afib s/p ablation In SR today in clinic. Previously felt heart racing while in Afib - 30day monitor as above  CHA2DS2-VASc Score = 3 [CHF History: 0, HTN History: 1, Diabetes History: 0, Stroke History: 0, Vascular Disease History: 0, Age Score: 1, Gender Score: 1].  Therefore, the patient's annual risk of stroke is 3.2 %.   Off [place]   #) murmur New murmur heard best on R sternal border. - updated echo to eval   #) HTN At goal today.  Recommend checking blood pressures 1-2 times per week at home and recording the values.  Recommend bringing these recordings to the primary care physician.  #) transferring care Patient is aware that Dr. [name] is no longer with practice. Her husband sees Dr. Myles Gip, and she requests to also follow-up with him.    Current medicines are reviewed at length with the patient today.   The patient does not have concerns regarding her medicines.  The following changes were made today:  none  Labs/ tests ordered today include:  Orders Placed This Encounter  Procedures   EKG 12-Lead   ECHOCARDIOGRAM COMPLETE     Disposition: Follow up with EP APP in 3 months   Signed, Mamie Levers, NP  03/27/22 4:10 PM   [place]  [id number] [phone] (office)  [phone] (fax)

## 2022-03-27 ENCOUNTER — Other Ambulatory Visit: Payer: Self-pay | Admitting: Cardiology

## 2022-03-27 ENCOUNTER — Ambulatory Visit: Payer: Medicare Other | Attending: Physician Assistant | Admitting: Cardiology

## 2022-03-27 VITALS — BP 136/82 | HR 78 | Ht 62.0 in | Wt 159.2 lb

## 2022-03-27 DIAGNOSIS — R011 Cardiac murmur, unspecified: Secondary | ICD-10-CM

## 2022-03-27 DIAGNOSIS — I10 Essential (primary) hypertension: Secondary | ICD-10-CM

## 2022-03-27 DIAGNOSIS — R002 Palpitations: Secondary | ICD-10-CM

## 2022-03-27 DIAGNOSIS — I480 Paroxysmal atrial fibrillation: Secondary | ICD-10-CM

## 2022-03-27 NOTE — Patient Instructions (Signed)
Medication Instructions:   Your physician recommends that you continue on your current medications as directed. Please refer to the Current Medication list given to you today.  *If you need a refill on your cardiac medications before your next appointment, please call your pharmacy*   Lab Work: [place]   If you have labs (blood work) drawn today and your tests are completely normal, you will receive your results only by: [place] (if you have MyChart) OR A paper copy in the mail If you have any lab test that is abnormal or we need to change your treatment, we will call you to review the results.   Testing/Procedures: Your physician has requested that you have an echocardiogram. Echocardiography is a painless test that uses sound waves to create images of your heart. It provides your doctor with information about the size and shape of your heart and how well your heart's chambers and valves are working. This procedure takes approximately one hour. There are no restrictions for this procedure. Please do NOT wear cologne, perfume, aftershave, or lotions (deodorant is allowed). Please arrive 15 minutes prior to your appointment time.   Your physician has recommended that you wear an event monitor. Event monitors are medical devices that record the heart's electrical activity. Doctors most often [place] these monitors to diagnose arrhythmias. Arrhythmias are problems with the speed or rhythm of the heartbeat. The monitor is a small, portable device. You can wear one while you do your normal daily activities. This is usually used to diagnose what is causing palpitations/syncope (passing out).     Follow-Up: At [facility], you and your health needs are our priority.  As part of our continuing mission to provide you with exceptional heart care, we have created designated Provider Care Teams.  These Care Teams include your primary Cardiologist (physician) and Advanced  Practice Providers (APPs -  Physician Assistants and Nurse Practitioners) who all work together to provide you with the care you need, when you need it.  We recommend signing up for the patient portal called "MyChart".  Sign up information is provided on this After Visit Summary.  MyChart is used to connect with patients for Virtual Visits (Telemedicine).  Patients are able to view lab/test results, encounter notes, upcoming appointments, etc.  Non-urgent messages can be sent to your provider as well.   To learn more about what you can do with MyChart, go to [email or web address].    Your next appointment:   3 month(s)  The format for your next appointment:   In Person  Provider:   You may see [name], MD or one of the following Advanced Practice Providers on your designated Care Team:   Tommye Standard, [place] Legrand Como "Oda Kilts, [place]   Other Instructions  [employer] Cardiac Event Monitor Instructions Your physician has requested you wear your cardiac event monitor for _30____ days, (1-30). [employer] may call or text to confirm a shipping address. The monitor will be sent to a land address via UPS. [employer] will not ship a monitor to a PO BOX. It typically takes 3-5 days to receive your monitor after it has been enrolled. [employer] will assist with USPS tracking if your package is delayed. The telephone number for [employer] is [phone]. Once you have received your monitor, please review the enclosed instructions. Instruction tutorials can also be viewed under help and settings on the enclosed cell phone. Your monitor has already been registered assigning a specific monitor serial # to  you.  Applying the monitor Remove cell phone from case and turn it on. The cell phone works as Dealer and needs to be within [employer] of you at all times. The cell phone will need to be charged on a daily basis. We recommend you plug the cell phone into the enclosed charger  at your bedside table every night.  Monitor batteries: You will receive two monitor batteries labelled #1 and #2. These are your recorders. Plug battery #2 onto the second connection on the enclosed charger. Keep one battery on the charger at all times. This will keep the monitor battery deactivated. It will also keep it fully charged for when you need to switch your monitor batteries. A small light will be blinking on the battery emblem when it is charging. The light on the battery emblem will remain on when the battery is fully charged.  Open package of a Monitor strip. Insert battery #1 into black hood on strip and gently squeeze monitor battery onto connection as indicated in instruction booklet. Set aside while preparing skin.  Choose location for your strip, vertical or horizontal, as indicated in the instruction booklet. Shave to remove all hair from location. There cannot be any lotions, oils, powders, or colognes on skin where monitor is to be applied. Wipe skin clean with enclosed Saline wipe. Dry skin completely.  Peel paper labeled #1 off the back of the Monitor strip exposing the adhesive. Place the monitor on the chest in the vertical or horizontal position shown in the instruction booklet. One arrow on the monitor strip must be pointing upward. Carefully remove paper labeled #2, attaching remainder of strip to your skin. Try not to create any folds or wrinkles in the strip as you apply it.  Firmly press and release the circle in the center of the monitor battery. You will hear a small beep. This is turning the monitor battery on. The heart emblem on the monitor battery will light up every 5 seconds if the monitor battery in turned on and connected to the patient securely. Do not push and hold the circle down as this turns the monitor battery off. The cell phone will locate the monitor battery. A screen will appear on the cell phone checking the connection of your monitor strip.  This may read poor connection initially but change to good connection within the next minute. Once your monitor accepts the connection you will hear a series of 3 beeps followed by a climbing crescendo of beeps. A screen will appear on the cell phone showing the two monitor strip placement options. Touch the picture that demonstrates where you applied the monitor strip.  Your monitor strip and battery are waterproof. You are able to shower, bathe, or swim with the monitor on. They just ask you do not submerge deeper than 3 feet underwater. We recommend removing the monitor if you are swimming in a lake, river, or ocean.  Your monitor battery will need to be switched to a fully charged monitor battery approximately once a week. The cell phone will alert you of an action which needs to be made.  On the cell phone, tap for details to reveal connection status, monitor battery status, and cell phone battery status. The green dots indicates your monitor is in good status. A red dot indicates there is something that needs your attention.  To record a symptom, click the circle on the monitor battery. In 30-60 seconds a list of symptoms will appear on the  cell phone. Select your symptom and tap save. Your monitor will record a sustained or significant arrhythmia regardless of you clicking the button. Some patients do not feel the heart rhythm irregularities. [employer] will notify us of any serious or critical events.  Refer to instruction booklet for instructions on switching batteries, changing strips, the Do not disturb or Pause features, or any additional questions.  Call [employer] at [phone], to confirm your monitor is transmitting and record your baseline. They will answer any questions you may have regarding the monitor instructions at that time.  Returning the monitor to [place] all equipment back into blue box. Peel off strip of paper to expose adhesive and close box  securely. There is a prepaid UPS shipping label on this box. Drop in a UPS drop box, or at a UPS facility like [employer]. You may also contact [employer] to arrange UPS to pick up monitor package at your home.  Important Information About [place]

## 2022-04-05 ENCOUNTER — Encounter: Payer: Self-pay | Admitting: Cardiovascular Disease

## 2022-04-07 ENCOUNTER — Ambulatory Visit: Payer: Medicare Other | Attending: Cardiology

## 2022-04-07 DIAGNOSIS — R002 Palpitations: Secondary | ICD-10-CM | POA: Diagnosis not present

## 2022-04-07 DIAGNOSIS — R011 Cardiac murmur, unspecified: Secondary | ICD-10-CM

## 2022-04-07 DIAGNOSIS — I480 Paroxysmal atrial fibrillation: Secondary | ICD-10-CM | POA: Diagnosis not present

## 2022-04-14 ENCOUNTER — Ambulatory Visit: Payer: Medicare Other | Admitting: Orthopaedic Surgery

## 2022-04-14 ENCOUNTER — Encounter: Payer: Self-pay | Admitting: Orthopaedic Surgery

## 2022-04-14 VITALS — BP 158/76 | HR 78 | Ht 62.0 in | Wt 160.0 lb

## 2022-04-14 DIAGNOSIS — M79605 Pain in left leg: Secondary | ICD-10-CM

## 2022-04-14 DIAGNOSIS — M79641 Pain in right hand: Secondary | ICD-10-CM | POA: Diagnosis not present

## 2022-04-14 DIAGNOSIS — M545 Low back pain, unspecified: Secondary | ICD-10-CM | POA: Diagnosis not present

## 2022-04-14 NOTE — Patient Instructions (Addendum)
2 WKS ROV LBP  Start the home exercises provided to you today during your visit. Do them once daily.  Dr.Keeling is here all day on Tuesdays. He is here half a day on Wednesday mornings, and Thursday mornings. If you need anything such as a medication refill, please either call BEFORE the end of the day on [facility] or send a message through [place]. Your pharmacy can send a refill request for you. Calling by the end of the day on [facility] allows [place] time to send Dr.Keeling the request and for him to respond before he leaves on Thursdays.   My name is Abby and I assist [place]. If you need anything before your next appointment, please do not hesitate to call the office at [phone] and ask to leave a message for me. I will respond within 24-48 business hours.   Aspercreme, Biofreeze, Blue Emu or Voltaren Gel over the counter 2-3 times daily. Rub into area well each use for best results.  As the weather changes and gets cooler, you may notice you are affected more. You may have more pain in your joints. This is normal. Dress warmly and make sure that area is covered well. Even if you are just stepping outside to grab the mail or start your car.

## 2022-04-14 NOTE — Progress Notes (Signed)
Subjective:    Patient ID: Paige Jones, female    DOB: 10-01-1950, 72 y.o.   MRN: [id number]  HPI She has had back pain since the beginning of this new year right after putting away multiple Christmas decorations.  She is doing better over the last few days and thought she might cancel.  She is taking diclofenac which has really helped.  She also has arthritis in the hands, more on the right and also at the base of the right thumb.  She has no trauma.   Review of Systems  Constitutional:  Positive for activity change.  Musculoskeletal:  Positive for arthralgias, back pain, joint swelling and myalgias.  All other systems reviewed and are negative. For Review of Systems, all other systems reviewed and are negative.  The following is a summary of the past history medically, past history surgically, known current medicines, social history and family history.  This information is gathered electronically by the computer from prior information and documentation.  I review this each visit and have found including this information at this point in the chart is beneficial and informative.   Past Medical History:  Diagnosis Date   Arachnoid cyst    Arthritis    Basal cell carcinoma    Hyperlipidemia    Hypertension    Paroxysmal atrial fibrillation (HCC)    Thyroid goiter     Past Surgical History:  Procedure Laterality Date   ATRIAL FIBRILLATION ABLATION N/A 08/30/2018   Procedure: ATRIAL FIBRILLATION ABLATION;  Surgeon: Thompson Grayer, MD;  Location: [facility];  Service: Cardiovascular;  Laterality: N/A;   BASAL CELL CARCINOMA EXCISION     HYSTERECTOMY ABDOMINAL WITH SALPINGECTOMY     implantable loop recorder implantation  12/19/2018    Medtronic Reveal [place] model LNQ11 (SN [id number])  implantable loop recorder implanted by Dr [name] in office for afib management post ablation   implantable loop recorder removal  06/12/2021   MDT reveal LINQ removed by Dr [name]    TONSILLECTOMY AND ADENOIDECTOMY      Current Outpatient Medications on File Prior to Visit  Medication Sig Dispense Refill   acetaminophen (TYLENOL) 500 MG tablet Take 500 mg by mouth every 6 (six) hours as needed for mild pain.     atorvastatin (LIPITOR) 20 MG tablet TAKE ONE TABLET BY MOUTH ONCE DAILY. 30 tablet 6   azelastine (ASTELIN) 0.1 % nasal spray Place 1 spray into the nose 2 (two) times daily as needed for allergies.      calcium carbonate (OS-CAL) 600 MG TABS tablet Take 600 mg by mouth daily.      cetirizine (ZYRTEC) 10 MG tablet Take 10 mg by mouth at bedtime as needed for allergies.     diclofenac (VOLTAREN) 75 MG EC tablet TAKE (1) TABLET BY MOUTH TWICE DAILY. 180 tablet 0   diclofenac sodium (VOLTAREN) 1 % GEL Apply 4 g topically 4 (four) times daily. 100 g 3   estradiol (VIVELLE-DOT) 0.025 MG/24HR Place 1 patch onto the skin 2 (two) times a week.     fenofibrate 160 MG tablet TAKE ONE TABLET BY MOUTH ONCE DAILY. 90 tablet 0   fluticasone (FLONASE) 50 MCG/ACT nasal spray Place 2 sprays into both nostrils as needed for allergies.      furosemide (LASIX) 20 MG tablet Take 20 mg by mouth as needed for fluid.     levothyroxine (SYNTHROID) 88 MCG tablet Take 88 mcg by mouth daily.      losartan (  COZAAR) 25 MG tablet Take 25 mg by mouth daily.     Multiple Vitamins-Minerals (CENTRUM SILVER 50+WOMEN) TABS Take by mouth.     Omega-3 Fatty Acids (FISH OIL) 1000 MG CAPS Take 1,000 mg by mouth daily.      omeprazole (PRILOSEC) 20 MG capsule TAKE (1) CAPSULE BY MOUTH ONCE DAILY. 90 capsule 0   TURMERIC PO Take 550 mg by mouth daily.     vitamin B-12 (CYANOCOBALAMIN) 500 MCG tablet Take 500 mcg by mouth daily.     vitamin E 180 MG (400 UNITS) capsule Take 400 Units by mouth daily.      No current facility-administered medications on file prior to visit.    Social History   Socioeconomic History   Marital status: Married    Spouse name: Not on file   Number of children: Not on  file   Years of education: Not on file   Highest education level: Not on file  Occupational History   Not on file  Tobacco Use   Smoking status: Never   Smokeless tobacco: Never  Vaping Use   Vaping Use: Never used  Substance and Sexual Activity   Alcohol use: No    Alcohol/week: 0.0 standard drinks of alcohol   Drug use: No   Sexual activity: Not on file  Other Topics Concern   Not on file  Social History Narrative   Lives in [place] with spouse.   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   Social Determinants of Health   Financial Resource Strain: Not on file  Food Insecurity: Not on file  Transportation Needs: Not on file  Physical Activity: Not on file  Stress: Not on file  Social Connections: Not on file  Intimate Partner Violence: Not on file    Family History  Problem Relation Age of Onset   Thyroid disease Mother    Atrial fibrillation Mother    Fibromyalgia Mother    Arthritis Mother    Emphysema Father    Diabetes Father    Heart disease Father    Cancer Father        gall bladder    BP (!) 158/76   Pulse 78   Ht '5\' 2"'$  (1.575 m)   Wt 160 lb (72.6 kg)   BMI 29.26 kg/m   Body mass index is 29.26 kg/m.      Objective:   Physical Exam Vitals and nursing note reviewed. Exam conducted with a chaperone present.  Constitutional:      Appearance: She is well-developed.  HENT:     Head: Normocephalic and atraumatic.  Eyes:     Conjunctiva/sclera: Conjunctivae normal.     Pupils: Pupils are equal, round, and reactive to light.  Cardiovascular:     Rate and Rhythm: Normal rate and regular rhythm.  Pulmonary:     Effort: Pulmonary effort is normal.  Abdominal:     Palpations: Abdomen is soft.  Musculoskeletal:       Arms:       Hands:     Cervical back: Normal range of motion and neck supple.  Skin:    General: Skin is warm and dry.  Neurological:     Mental Status: She is alert and oriented to  person, place, and time.     Cranial Nerves: No cranial nerve deficit.     Motor: No abnormal muscle tone.     Coordination: Coordination normal.     Deep Tendon  Reflexes: Reflexes are normal and symmetric. Reflexes normal.  Psychiatric:        Behavior: Behavior normal.        Thought Content: Thought content normal.        Judgment: Judgment normal.           Assessment & Plan:   Encounter Diagnoses  Name Primary?   Lumbar pain with radiation down left leg Yes   Right hand pain    She is better with the back and I have given exercises to do and for her to continue the diclofenac.  I have recommended Voltaren Gel for the hands as well as a paraffin bath.  Return in two weeks.  If she is improved, call and cancel.  Call if any problem.  Precautions discussed.  Electronically Signed Sanjuana Kava, MD 1/23/20242:56 PM

## 2022-04-21 ENCOUNTER — Ambulatory Visit (HOSPITAL_COMMUNITY): Payer: Medicare Other | Attending: Internal Medicine

## 2022-04-21 DIAGNOSIS — R011 Cardiac murmur, unspecified: Secondary | ICD-10-CM | POA: Diagnosis present

## 2022-04-21 LAB — ECHOCARDIOGRAM COMPLETE
Area-P 1/2: 3.42 cm2
S' Lateral: 2.7 cm

## 2022-04-28 ENCOUNTER — Ambulatory Visit: Payer: Medicare Other | Admitting: Orthopaedic Surgery

## 2022-05-05 ENCOUNTER — Ambulatory Visit (INDEPENDENT_AMBULATORY_CARE_PROVIDER_SITE_OTHER): Payer: Medicare Other

## 2022-05-05 ENCOUNTER — Ambulatory Visit: Payer: Medicare Other | Admitting: Orthopaedic Surgery

## 2022-05-05 ENCOUNTER — Encounter: Payer: Self-pay | Admitting: Orthopaedic Surgery

## 2022-05-05 VITALS — BP 124/72 | HR 74

## 2022-05-05 DIAGNOSIS — M79605 Pain in left leg: Secondary | ICD-10-CM | POA: Diagnosis not present

## 2022-05-05 DIAGNOSIS — M79641 Pain in right hand: Secondary | ICD-10-CM

## 2022-05-05 DIAGNOSIS — M545 Low back pain, unspecified: Secondary | ICD-10-CM | POA: Diagnosis not present

## 2022-05-05 NOTE — Progress Notes (Signed)
My back hurts.  She has a "catch" in the left lower back with some slight paresthesias at time.  She has been doing the exercises I gave her. She also uses Voltaren Gel which helps some.  But she says the pain continues.  She has no trauma.  She has no weakness.  She has pain in the hands more in the DIP joints. She uses the Voltaren Gel on this as well as the paraffin bath which help.  She has no new trauma.  Spine/Pelvis examination:  Inspection:  Overall, sacoiliac joint benign and hips nontender; without crepitus or defects.   Thoracic spine inspection: Alignment normal without kyphosis present   Lumbar spine inspection:  Alignment  with normal lumbar lordosis, without scoliosis apparent.   Thoracic spine palpation:  without tenderness of spinal processes   Lumbar spine palpation: without tenderness of lumbar area; without tightness of lumbar muscles    Range of Motion:   Lumbar flexion, forward flexion is normal without pain or tenderness    Lumbar extension is full without pain or tenderness   Left lateral bend is normal without pain or tenderness   Right lateral bend is normal without pain or tenderness   Straight leg raising is normal  Strength & tone: normal   Stability overall normal stability  Hands show DIP deformity more at Index and long fingers, more on right hand.  Encounter Diagnoses  Name Primary?   Lumbar pain with radiation down left leg Yes   Right hand pain    X-rays were done of the lumbar spine, reported separately.  I will begin PT for her at [facility].  Continue the Voltaren Gel.  Return in two weeks.  Call if any problem.  Precautions discussed.  Electronically Signed Sanjuana Kava, MD 2/13/20242:42 PM

## 2022-05-05 NOTE — Patient Instructions (Signed)
[facility] Address: [address] [place], [place] [address] Phone: [phone]   If they haven't heard from them by the end of the week call to set up your first appt.

## 2022-05-08 ENCOUNTER — Telehealth: Payer: Self-pay | Admitting: Orthopaedic Surgery

## 2022-05-08 NOTE — Telephone Encounter (Signed)
Patient lvm stating that [facility] does not take her insurance, but [facility] in [place] does.  She would like a call back [phone]

## 2022-05-19 ENCOUNTER — Ambulatory Visit: Payer: Medicare Other | Admitting: Orthopaedic Surgery

## 2022-05-19 ENCOUNTER — Encounter: Payer: Self-pay | Admitting: Orthopaedic Surgery

## 2022-05-19 VITALS — BP 124/72 | Ht 62.0 in | Wt 160.0 lb

## 2022-05-19 DIAGNOSIS — M79605 Pain in left leg: Secondary | ICD-10-CM | POA: Diagnosis not present

## 2022-05-19 DIAGNOSIS — M545 Low back pain, unspecified: Secondary | ICD-10-CM

## 2022-05-19 NOTE — Progress Notes (Signed)
I am better.  She has been going to PT and is doing better.  She has less pain and more motion.  She feels better.  She has no new trauma or numbness or weakness.  Spine/Pelvis examination:  Inspection:  Overall, sacoiliac joint benign and hips nontender; without crepitus or defects.   Thoracic spine inspection: Alignment normal without kyphosis present   Lumbar spine inspection:  Alignment  with normal lumbar lordosis, without scoliosis apparent.   Thoracic spine palpation:  without tenderness of spinal processes   Lumbar spine palpation: without tenderness of lumbar area; without tightness of lumbar muscles    Range of Motion:   Lumbar flexion, forward flexion is normal without pain or tenderness    Lumbar extension is full without pain or tenderness   Left lateral bend is normal without pain or tenderness   Right lateral bend is normal without pain or tenderness   Straight leg raising is normal  Strength & tone: normal   Stability overall normal stability  Encounter Diagnosis  Name Primary?   Lumbar pain with radiation down left leg Yes   Continue PT.  Return in three weeks.  Continue exercises at home.  Call if any problem.  Precautions discussed.  Electronically Signed Sanjuana Kava, MD 2/27/20243:20 PM

## 2022-05-21 ENCOUNTER — Encounter: Payer: Self-pay | Admitting: Radiology

## 2022-06-09 ENCOUNTER — Ambulatory Visit: Payer: Medicare Other | Admitting: Orthopaedic Surgery

## 2022-06-23 ENCOUNTER — Encounter: Payer: Self-pay | Admitting: Orthopaedic Surgery

## 2022-06-23 ENCOUNTER — Ambulatory Visit: Payer: Medicare Other | Admitting: Orthopaedic Surgery

## 2022-06-23 VITALS — BP 128/74 | HR 69 | Ht 62.0 in | Wt 155.0 lb

## 2022-06-23 DIAGNOSIS — M79605 Pain in left leg: Secondary | ICD-10-CM

## 2022-06-23 DIAGNOSIS — M545 Low back pain, unspecified: Secondary | ICD-10-CM | POA: Diagnosis not present

## 2022-06-23 NOTE — Progress Notes (Signed)
I am much better.  She has been to PT and is significantly improved. I have reviewed the PT notes.  She is sleeping better, walking better with no paresthesias.  Spine/Pelvis examination:  Inspection:  Overall, sacoiliac joint benign and hips nontender; without crepitus or defects.   Thoracic spine inspection: Alignment normal without kyphosis present   Lumbar spine inspection:  Alignment  with normal lumbar lordosis, without scoliosis apparent.   Thoracic spine palpation:  without tenderness of spinal processes   Lumbar spine palpation: without tenderness of lumbar area; without tightness of lumbar muscles    Range of Motion:   Lumbar flexion, forward flexion is normal without pain or tenderness    Lumbar extension is full without pain or tenderness   Left lateral bend is normal without pain or tenderness   Right lateral bend is normal without pain or tenderness   Straight leg raising is normal  Strength & tone: normal   Stability overall normal stability  Encounter Diagnosis  Name Primary?   Lumbar pain with radiation down left leg Yes   I will see in six weeks.  Call and cancel if doing well.  Call if any problem.  Precautions discussed.  Electronically Signed Sanjuana Kava, MD 4/2/20241:51 PM

## 2022-06-26 ENCOUNTER — Ambulatory Visit: Payer: Medicare Other | Admitting: Student

## 2022-07-24 ENCOUNTER — Ambulatory Visit: Payer: Medicare Other | Attending: Cardiovascular Disease | Admitting: Cardiovascular Disease

## 2022-07-24 ENCOUNTER — Encounter: Payer: Self-pay | Admitting: Cardiovascular Disease

## 2022-07-24 VITALS — BP 136/78 | HR 68 | Ht 62.0 in | Wt 158.8 lb

## 2022-07-24 DIAGNOSIS — I480 Paroxysmal atrial fibrillation: Secondary | ICD-10-CM

## 2022-07-24 DIAGNOSIS — I10 Essential (primary) hypertension: Secondary | ICD-10-CM

## 2022-07-24 NOTE — Patient Instructions (Signed)
Medication Instructions:  Continue all current medications.  Labwork: none  Testing/Procedures: none  Follow-Up: 1 year - Dr.  Mealor   Any Other Special Instructions Will Be Listed Below (If Applicable).   If you need a refill on your cardiac medications before your next appointment, please call your pharmacy.; 

## 2022-07-24 NOTE — Progress Notes (Signed)
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 female who presents today for routine electrophysiology followup.  Since last being seen in our clinic, the patient reports doing very well.  Today, she denies symptoms of chest pain, shortness of breath,  lower extremity edema, dizziness, presyncope, or syncope.    She has had a few episodes of palpitations.  She saw Levy Sjogren in clinic in January at which time an echocardiogram was ordered and 30-day monitor placed.  Echocardiogram showed normal structure and function in the monitor which I reviewed and interpreted personally, showed sinus rhythm and occasional brief episodes of atrial runs, lasting only a few beats.  Her symptoms of racing heart were associated with mild sinus tachycardia.  The patient is otherwise without complaint today.   Past Medical History:  Diagnosis Date   Arachnoid cyst    Arthritis    Basal cell carcinoma    Hyperlipidemia    Hypertension    Paroxysmal atrial fibrillation (HCC)    Thyroid goiter    Past Surgical History:  Procedure Laterality Date   ATRIAL FIBRILLATION ABLATION N/A 08/30/2018   Procedure: ATRIAL FIBRILLATION ABLATION;  Surgeon: Hillis Range, MD;  Location: [facility];  Service: Cardiovascular;  Laterality: N/A;   BASAL CELL CARCINOMA EXCISION     HYSTERECTOMY ABDOMINAL WITH SALPINGECTOMY     implantable loop recorder implantation  12/19/2018    Medtronic Reveal [place] model LNQ11 (SN [id number])  implantable loop recorder implanted by Dr Johney Frame in office for afib management post ablation   implantable loop recorder removal  06/12/2021   MDT reveal LINQ removed by Dr Johney Frame   TONSILLECTOMY AND ADENOIDECTOMY      ROS- all systems are reviewed and negatives except as per HPI above  Current Outpatient Medications  Medication Sig Dispense Refill   acetaminophen (TYLENOL) 500 MG tablet Take 500 mg by mouth every 6 (six) hours as needed for mild  pain.     atorvastatin (LIPITOR) 20 MG tablet TAKE ONE TABLET BY MOUTH ONCE DAILY. 30 tablet 6   azelastine (ASTELIN) 0.1 % nasal spray Place 1 spray into the nose 2 (two) times daily as needed for allergies.      cetirizine (ZYRTEC) 10 MG tablet Take 10 mg by mouth at bedtime as needed for allergies.     diclofenac (VOLTAREN) 75 MG EC tablet TAKE (1) TABLET BY MOUTH TWICE DAILY. 180 tablet 0   diclofenac sodium (VOLTAREN) 1 % GEL Apply 4 g topically 4 (four) times daily. 100 g 3   estradiol (VIVELLE-DOT) 0.025 MG/24HR Place 1 patch onto the skin 2 (two) times a week.     fenofibrate 160 MG tablet TAKE ONE TABLET BY MOUTH ONCE DAILY. 90 tablet 0   fluticasone (FLONASE) 50 MCG/ACT nasal spray Place 2 sprays into both nostrils as needed for allergies.      folic acid (FOLVITE) 400 MCG tablet Take 400 mcg by mouth daily.     furosemide (LASIX) 20 MG tablet Take 20 mg by mouth as needed for fluid.     levothyroxine (SYNTHROID) 88 MCG tablet Take 88 mcg by mouth daily.      losartan (COZAAR) 25 MG tablet Take 25 mg by mouth daily.     Multiple Vitamins-Minerals (CENTRUM SILVER 50+WOMEN) TABS Take 1 tablet by mouth daily.     Omega-3 Fatty Acids (FISH OIL) 1000 MG CAPS Take 1,000 mg by mouth daily.  pantoprazole (PROTONIX) 20 MG tablet Take 20 mg by mouth daily.     TURMERIC PO Take 550 mg by mouth daily.     vitamin B-12 (CYANOCOBALAMIN) 500 MCG tablet Take 500 mcg by mouth daily.     vitamin E 180 MG (400 UNITS) capsule Take 400 Units by mouth daily.      No current facility-administered medications for this visit.    Physical Exam: Vitals:   07/24/22 1249  BP: 136/78  Pulse: 68  SpO2: 98%  Weight: 158 lb 12.8 oz (72 kg)  Height: 5\' 2"  (1.575 m)    Gen: Appears comfortable, well-nourished CV: RRR, no dependent edema Pulm: breathing easily   Wt Readings from Last 3 Encounters:  07/24/22 158 lb 12.8 oz (72 kg)  06/23/22 155 lb (70.3 kg)  05/19/22 160 lb (72.6 kg)    EKG  tracing ordered today is personally reviewed and shows sinus  Assessment and Plan:  Paroxysmal atrial fibrillation Well controlled post ablation We had a long discussion about her recent symptoms of palpitations. I do not think what she has been having is related to atrial fibrillation. I recommended she consider getting a Kardia mobile device Chads2vasc score is 3.  She will consider OAC if her AF returns   2. HTN Stable No change required today      Return in a year    Maurice Small, MD 07/24/2022 1:25 PM

## 2022-08-04 ENCOUNTER — Ambulatory Visit: Payer: Medicare Other | Admitting: Orthopaedic Surgery

## 2022-10-01 ENCOUNTER — Telehealth: Payer: Self-pay

## 2022-10-01 NOTE — Telephone Encounter (Signed)
Patient is requesting provider switch with her spouse from Dr. Nelly Laurence in [place] to Dr. Ladona Ridgel in [place],   Please advise if this is acceptable.

## 2022-12-28 ENCOUNTER — Ambulatory Visit: Payer: Medicare Other | Attending: Cardiology | Admitting: Cardiology

## 2022-12-28 ENCOUNTER — Encounter: Payer: Self-pay | Admitting: Cardiology

## 2022-12-28 VITALS — BP 124/78 | HR 63 | Ht 62.0 in | Wt 147.8 lb

## 2022-12-28 DIAGNOSIS — E785 Hyperlipidemia, unspecified: Secondary | ICD-10-CM | POA: Diagnosis not present

## 2022-12-28 DIAGNOSIS — I480 Paroxysmal atrial fibrillation: Secondary | ICD-10-CM

## 2022-12-28 DIAGNOSIS — I10 Essential (primary) hypertension: Secondary | ICD-10-CM

## 2022-12-28 MED ORDER — ATORVASTATIN CALCIUM 20 MG PO TABS: 20.0000 mg | ORAL_TABLET | ORAL | Status: DC

## 2022-12-28 NOTE — Patient Instructions (Addendum)
Medication Instructions:   Change your Atorvastatin to 20mg  every other day Stop Losartan (Cozaar) Continue all other medications.     Labwork:  none  Testing/Procedures:  none  Follow-Up:  6 months   Any Other Special Instructions Will Be Listed Below (If Applicable).  Call office with update on BP in 2 weeks   If you need a refill on your cardiac medications before your next appointment, please call your pharmacy.

## 2022-12-28 NOTE — Progress Notes (Signed)
Clinical Summary Paige Jones is a 72 y.o.female  1.PAF - prior ablation 08/2018 - had extended loop recoreder after ablation without recurrent afib. Has been taken off anticoag 04/2022 30 day monitor: Sinus rhythm HR 53-139, avg 74 bpm Symptoms of flutter and skipped beats were associated with sinus rhythm. Brief atrial runs occurred. Overall burden of ectopy was < 1%  - from prior notes failed flecanide, multaq ineffective  - symptoms resolved.   2.HTN - home bp's 120-130s/70s - some dizziness after losartan 25mg  in the morning,  - weight down 12 lbs   3. HLD - has been on atorvastatin 20mg  several years - recent muscle aches - headaches aches crestor - 02/2022 TC 157 TG 141 HDL 48 LDL 84     4. Aortic valve sclerosis - Jan 2024 echo: LVEF 60-65%, no WMAs, mild calcification of AV without stenosis.    Husband is Makaiah Terwilliger who is also a patient.   Past Medical History:  Diagnosis Date   Arachnoid cyst    Arthritis    Basal cell carcinoma    Hyperlipidemia    Hypertension    Paroxysmal atrial fibrillation (HCC)    Thyroid goiter      Allergies  Allergen Reactions   Melatonin Itching    "Keeps me awake"   Pseudoephedrine     Elevated BP   Rosuvastatin     headache   Apixaban Other (See Comments)   Benadryl [Diphenhydramine] Itching   Prednisone     Went into afib   Rivaroxaban Other (See Comments)     Current Outpatient Medications  Medication Sig Dispense Refill   acetaminophen (TYLENOL) 500 MG tablet Take 500 mg by mouth every 6 (six) hours as needed for mild pain.     atorvastatin (LIPITOR) 20 MG tablet TAKE ONE TABLET BY MOUTH ONCE DAILY. 30 tablet 6   azelastine (ASTELIN) 0.1 % nasal spray Place 1 spray into the nose 2 (two) times daily as needed for allergies.      cetirizine (ZYRTEC) 10 MG tablet Take 10 mg by mouth at bedtime as needed for allergies.     diclofenac (VOLTAREN) 75 MG EC tablet TAKE (1) TABLET BY MOUTH TWICE DAILY. 180  tablet 0   diclofenac sodium (VOLTAREN) 1 % GEL Apply 4 g topically 4 (four) times daily. 100 g 3   estradiol (VIVELLE-DOT) 0.025 MG/24HR Place 1 patch onto the skin 2 (two) times a week.     fenofibrate 160 MG tablet TAKE ONE TABLET BY MOUTH ONCE DAILY. 90 tablet 0   fluticasone (FLONASE) 50 MCG/ACT nasal spray Place 2 sprays into both nostrils as needed for allergies.      folic acid (FOLVITE) 400 MCG tablet Take 400 mcg by mouth daily.     furosemide (LASIX) 20 MG tablet Take 20 mg by mouth as needed for fluid.     levothyroxine (SYNTHROID) 88 MCG tablet Take 88 mcg by mouth daily.      losartan (COZAAR) 25 MG tablet Take 25 mg by mouth daily.     Multiple Vitamins-Minerals (CENTRUM SILVER 50+WOMEN) TABS Take 1 tablet by mouth daily.     Omega-3 Fatty Acids (FISH OIL) 1000 MG CAPS Take 1,000 mg by mouth daily.      pantoprazole (PROTONIX) 20 MG tablet Take 20 mg by mouth daily.     TURMERIC PO Take 550 mg by mouth daily.     vitamin B-12 (CYANOCOBALAMIN) 500 MCG tablet Take 500 mcg by  mouth daily.     vitamin E 180 MG (400 UNITS) capsule Take 400 Units by mouth daily.      No current facility-administered medications for this visit.     Past Surgical History:  Procedure Laterality Date   ATRIAL FIBRILLATION ABLATION N/A 08/30/2018   Procedure: ATRIAL FIBRILLATION ABLATION;  Surgeon: Hillis Range, MD;  Location: [facility];  Service: Cardiovascular;  Laterality: N/A;   BASAL CELL CARCINOMA EXCISION     HYSTERECTOMY ABDOMINAL WITH SALPINGECTOMY     implantable loop recorder implantation  12/19/2018    Medtronic Reveal [place] model LNQ11 (SN [id number])  implantable loop recorder implanted by Dr Johney Frame in office for afib management post ablation   implantable loop recorder removal  06/12/2021   MDT reveal LINQ removed by Dr Johney Frame   TONSILLECTOMY AND ADENOIDECTOMY       Allergies  Allergen Reactions   Melatonin Itching    "Keeps me awake"   Pseudoephedrine     Elevated  BP   Rosuvastatin     headache   Apixaban Other (See Comments)   Benadryl [Diphenhydramine] Itching   Prednisone     Went into afib   Rivaroxaban Other (See Comments)      Family History  Problem Relation Age of Onset   Thyroid disease Mother    Atrial fibrillation Mother    Fibromyalgia Mother    Arthritis Mother    Emphysema Father    Diabetes Father    Heart disease Father    Cancer Father        gall bladder     Social History Ms. Badia reports that she has never smoked. She has never used smokeless tobacco. Ms. Draughon reports no history of alcohol use.   Review of Systems CONSTITUTIONAL: No weight loss, fever, chills, weakness or fatigue.  HEENT: Eyes: No visual loss, blurred vision, double vision or yellow sclerae.No hearing loss, sneezing, congestion, runny nose or sore throat.  SKIN: No rash or itching.  CARDIOVASCULAR: per hpi RESPIRATORY: No shortness of breath, cough or sputum.  GASTROINTESTINAL: No anorexia, nausea, vomiting or diarrhea. No abdominal pain or blood.  GENITOURINARY: No burning on urination, no polyuria NEUROLOGICAL: No headache, dizziness, syncope, paralysis, ataxia, numbness or tingling in the extremities. No change in bowel or bladder control.  MUSCULOSKELETAL: No muscle, back pain, joint pain or stiffness.  LYMPHATICS: No enlarged nodes. No history of splenectomy.  PSYCHIATRIC: No history of depression or anxiety.  ENDOCRINOLOGIC: No reports of sweating, cold or heat intolerance. No polyuria or polydipsia.  Marland Kitchen   Physical Examination Today's Vitals   12/28/22 1437  BP: 124/78  Pulse: 63  SpO2: 99%  Weight: 147 lb 12.8 oz (67 kg)  Height: 5\' 2"  (1.575 m)   Body mass index is 27.03 kg/m.  Gen: resting comfortably, no acute distress HEENT: no scleral icterus, pupils equal round and reactive, no palptable cervical adenopathy,  CV: RRR, 2/6 systolic murmur rusb, no jvd Resp: Clear to auscultation bilaterally GI: abdomen is soft,  non-tender, non-distended, normal bowel sounds, no hepatosplenomegaly MSK: extremities are warm, no edema.  Skin: warm, no rash Neuro:  no focal deficits Psych: appropriate affect     Assessment and Plan   1.Afib - prior ablation, doing well without symptoms. From notes no recurrence by ILR after ablation and has not resumed anticoag - palpitations at the beginning of the year, monitor showed no afib. Infrequent short runs of SVT. - symptoms have since resolved, monitor at this time -  EKG today SR, isolated PVC  2. HTN - dizziness with taking losartan - has lost 12 lbs, follow home bp's off losartan. If SBP above 130 restart at 12.5mg   3. HLD - muscle aches on atorvastatin, prior symptoms on crestor - try atorvastatin 20mg  every other day, update [place] in 2 weeks on symptoms   F/u 6 months     Antoine Poche, M.D.

## 2023-01-18 ENCOUNTER — Encounter: Payer: Self-pay | Admitting: Cardiology

## 2023-01-26 NOTE — Telephone Encounter (Signed)
Ok numbers look good, fine to remain off the losartan, no longer requires it for blood pressure  Dominga Ferry  MD

## 2023-02-11 ENCOUNTER — Encounter: Payer: Self-pay | Admitting: Internal Medicine

## 2023-02-11 ENCOUNTER — Ambulatory Visit: Payer: Medicare Other | Attending: Internal Medicine | Admitting: Internal Medicine

## 2023-02-11 VITALS — BP 134/84 | HR 64 | Ht 62.0 in | Wt 146.0 lb

## 2023-02-11 DIAGNOSIS — I480 Paroxysmal atrial fibrillation: Secondary | ICD-10-CM | POA: Diagnosis not present

## 2023-02-11 NOTE — Progress Notes (Signed)
HPI Paige Jones is a 72 y.o. female who presents today for routine electrophysiology followup.  She has a h/o PAF and is s/p catheter ablation.  She has had a few episodes of palpitations.  The patient denies syncope and she wore a cardiac monitor which showed nonsustained AT but no atrial fib. She also had symptomatic sinus tachycardia. Previously after  her ablation she had an ILR inserted which did not show any sustained atrial fib.    Allergies  Allergen Reactions   Melatonin Itching    "Keeps me awake"   Pseudoephedrine     Elevated BP   Rosuvastatin     headache   Apixaban Other (See Comments)   Benadryl [Diphenhydramine] Itching   Prednisone     Went into afib   Rivaroxaban Other (See Comments)     Current Outpatient Medications  Medication Sig Dispense Refill   acetaminophen (TYLENOL) 500 MG tablet Take 500 mg by mouth every 6 (six) hours as needed for mild pain.     atorvastatin (LIPITOR) 20 MG tablet Take 1 tablet (20 mg total) by mouth every other day.     azelastine (ASTELIN) 0.1 % nasal spray Place 1 spray into the nose 2 (two) times daily as needed for allergies.      diclofenac (VOLTAREN) 75 MG EC tablet TAKE (1) TABLET BY MOUTH TWICE DAILY. (Patient taking differently: Take 75 mg by mouth daily.) 180 tablet 0   diclofenac sodium (VOLTAREN) 1 % GEL Apply 4 g topically 4 (four) times daily. 100 g 3   estradiol (VIVELLE-DOT) 0.025 MG/24HR Place 1 patch onto the skin 2 (two) times a week.     fenofibrate 160 MG tablet TAKE ONE TABLET BY MOUTH ONCE DAILY. 90 tablet 0   fluorouracil (EFUDEX) 5 % cream Apply topically daily as needed.     fluticasone (FLONASE) 50 MCG/ACT nasal spray Place 2 sprays into both nostrils as needed for allergies.      folic acid (FOLVITE) 400 MCG tablet Take 400 mcg by mouth daily.     furosemide (LASIX) 20 MG tablet Take 20 mg by mouth as needed for fluid.     levothyroxine (SYNTHROID) 88 MCG tablet Take 88 mcg by mouth daily.       Multiple Vitamins-Minerals (CENTRUM SILVER 50+WOMEN) TABS Take 1 tablet by mouth daily.     Omega-3 Fatty Acids (FISH OIL) 1000 MG CAPS Take 1,000 mg by mouth daily.      omeprazole (PRILOSEC) 20 MG capsule Take 20 mg by mouth daily.     TURMERIC PO Take 550 mg by mouth daily.     vitamin B-12 (CYANOCOBALAMIN) 500 MCG tablet Take 500 mcg by mouth daily.     vitamin E 180 MG (400 UNITS) capsule Take 400 Units by mouth daily.      No current facility-administered medications for this visit.     Past Medical History:  Diagnosis Date   Arachnoid cyst    Arthritis    Basal cell carcinoma    Hyperlipidemia    Hypertension    Paroxysmal atrial fibrillation (HCC)    Thyroid goiter     ROS:   All systems reviewed and negative except as noted in the HPI.   Past Surgical History:  Procedure Laterality Date   ATRIAL FIBRILLATION ABLATION N/A 08/30/2018   Procedure: ATRIAL FIBRILLATION ABLATION;  Surgeon: Hillis Range, MD;  Location: [facility];  Service: Cardiovascular;  Laterality: N/A;   BASAL  CELL CARCINOMA EXCISION     HYSTERECTOMY ABDOMINAL WITH SALPINGECTOMY     implantable loop recorder implantation  12/19/2018    Medtronic Reveal [place] model LNQ11 (SN [id number])  implantable loop recorder implanted by Dr Johney Frame in office for afib management post ablation   implantable loop recorder removal  06/12/2021   MDT reveal LINQ removed by Dr Johney Frame   TONSILLECTOMY AND ADENOIDECTOMY       Family History  Problem Relation Age of Onset   Thyroid disease Mother    Atrial fibrillation Mother    Fibromyalgia Mother    Arthritis Mother    Emphysema Father    Diabetes Father    Heart disease Father    Cancer Father        gall bladder     Social History   Socioeconomic History   Marital status: Married    Spouse name: Not on file   Number of children: Not on file   Years of education: Not on file   Highest education level: Not on file  Occupational History   Not on  file  Tobacco Use   Smoking status: Never   Smokeless tobacco: Never  Vaping Use   Vaping status: Never Used  Substance and Sexual Activity   Alcohol use: No    Alcohol/week: 0.0 standard drinks of alcohol   Drug use: No   Sexual activity: Not on file  Other Topics Concern   Not on file  Social History Narrative   Lives in [place] with spouse.   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   Social Determinants of Health   Financial Resource Strain: Low Risk  (03/19/2022)   Received from [facility], [facility]   Overall Financial Resource Strain (CARDIA)    Difficulty of Paying Living Expenses: Not hard at all  Food Insecurity: No Food Insecurity (03/19/2022)   Received from [facility], [facility]   Hunger Vital Sign    Worried About Running Out of Food in the Last Year: Never true    Ran Out of Food in the Last Year: Never true  Transportation Needs: No Transportation Needs (02/17/2021)   Received from [facility], [facility]   PRAPARE - Transportation    Lack of Transportation (Medical): No    Lack of Transportation (Non-Medical): No  Physical Activity: Insufficiently Active (03/19/2022)   Received from [facility], [facility]   Exercise Vital Sign    Days of Exercise per Week: 3 days    Minutes of Exercise per Session: 30 min  Stress: No Stress Concern Present (03/19/2022)   Received from [facility], [facility] - Occupational Stress Questionnaire    Feeling of Stress : Not at all  Social Connections: Socially Integrated (03/19/2022)   Received from [facility], [facility]   Social Network    How would you rate your social network (family, work, friends)?: Good participation with social networks  Intimate Partner Violence: Not At Risk (03/19/2022)   Received from [facility], [facility]   HITS    Over the last 12 months how often did your partner  physically hurt you?: Never    Over the last 12 months how often did your partner insult you or talk down to you?: Never    Over the last 12 months how often did your partner threaten you with physical harm?: Never    Over the last 12 months how  often did your partner scream or curse at you?: Never     BP 134/84   Pulse 64   Ht 5\' 2"  (1.575 m)   Wt 146 lb (66.2 kg)   SpO2 98%   BMI 26.70 kg/m   Physical Exam:  Well appearing 72 yo woman, NAD HEENT: Unremarkable Neck:  No JVD, no thyromegally Lymphatics:  No adenopathy Back:  No CVA tenderness Lungs:  Clear with no wheezes HEART:  Regular rate rhythm, no murmurs, no rubs, no clicks Abd:  soft, positive bowel sounds, no organomegally, no rebound, no guarding Ext:  2 plus pulses, no edema, no cyanosis, no clubbing Skin:  No rashes no nodules Neuro:  CN II through XII intact, motor grossly intact  Assess/Plan:  Paroxysmal atrial fibrillation Well controlled post ablation We had a long discussion about her symptoms and long term expectations. I suspect that she will eventually develop more atrial fib but she has none currently. We discussed another ILR vs systemic anti-coagulation vs watchful waiting. I think that watchful waiting is most appropriate. Dr. Johney Frame had discussed a Kardia mobile app. If she has more palpitations then the Lourena Simmonds might be the best choice. Her CHADSVASC is such that if we document atrial fib, then systemic anti-coagulation.    2. HTN Stable No change required today   Paige Jones

## 2023-02-11 NOTE — Patient Instructions (Signed)

## 2023-03-19 ENCOUNTER — Encounter: Payer: Self-pay | Admitting: Cardiology

## 2023-07-05 ENCOUNTER — Encounter: Payer: Self-pay | Admitting: *Deleted

## 2023-07-07 ENCOUNTER — Ambulatory Visit: Payer: Medicare Other | Attending: Cardiology | Admitting: Cardiology

## 2023-07-07 ENCOUNTER — Encounter: Payer: Self-pay | Admitting: Cardiology

## 2023-07-07 VITALS — BP 130/88 | HR 67 | Ht 62.0 in | Wt 157.6 lb

## 2023-07-07 DIAGNOSIS — I480 Paroxysmal atrial fibrillation: Secondary | ICD-10-CM

## 2023-07-07 DIAGNOSIS — E785 Hyperlipidemia, unspecified: Secondary | ICD-10-CM

## 2023-07-07 DIAGNOSIS — I10 Essential (primary) hypertension: Secondary | ICD-10-CM

## 2023-07-07 NOTE — Progress Notes (Signed)
 Clinical Summary Paige Jones is a 73 y.o.female seen today for follow up of the following medical problems.     1.PAF - prior ablation 08/2018 - had extended loop recoreder after ablation without recurrent afib. Has been taken off anticoag 04/2022 30 day monitor: Sinus rhythm HR 53-139, avg 74 bpm Symptoms of flutter and skipped beats were associated with sinus rhythm. Brief atrial runs occurred. Overall burden of ectopy was < 1%  - from prior notes failed flecanide, multaq ineffective  -EKG todays show NSR - rare infrequent symptoms, lasting just a few seconds.   - compliant with meds   2.HTN - dizziness on losartan, we stopped the medication and f/u home bp's were fine.      3. HLD - has been on atorvastatin 20mg  several years - recent muscle aches - headaches aches crestor - 02/2022 TC 157 TG 141 HDL 48 LDL 84    Jan 2025 TC 166 TG 96 HDL 58 LDL 90     4. Aortic valve sclerosis - Jan 2024 echo: LVEF 60-65%, no WMAs, mild calcification of AV without stenosis.      Husband is Paige Jones who is also a patient.  Past Medical History:  Diagnosis Date   Arachnoid cyst    Arthritis    Basal cell carcinoma    Hyperlipidemia    Hypertension    Paroxysmal atrial fibrillation (HCC)    Thyroid goiter      Allergies  Allergen Reactions   Melatonin Itching    "Keeps me awake"   Pseudoephedrine     Elevated BP   Rosuvastatin     headache   Apixaban Other (See Comments)   Benadryl [Diphenhydramine] Itching   Prednisone     Went into afib   Rivaroxaban Other (See Comments)     Current Outpatient Medications  Medication Sig Dispense Refill   acetaminophen (TYLENOL) 500 MG tablet Take 500 mg by mouth every 6 (six) hours as needed for mild pain.     atorvastatin (LIPITOR) 20 MG tablet Take 1 tablet (20 mg total) by mouth every other day.     azelastine (ASTELIN) 0.1 % nasal spray Place 1 spray into the nose 2 (two) times daily as needed for allergies.       diclofenac (VOLTAREN) 75 MG EC tablet TAKE (1) TABLET BY MOUTH TWICE DAILY. (Patient taking differently: Take 75 mg by mouth daily.) 180 tablet 0   diclofenac sodium (VOLTAREN) 1 % GEL Apply 4 g topically 4 (four) times daily. 100 g 3   estradiol (VIVELLE-DOT) 0.025 MG/24HR Place 1 patch onto the skin 2 (two) times a week.     fenofibrate 160 MG tablet TAKE ONE TABLET BY MOUTH ONCE DAILY. 90 tablet 0   fluorouracil (EFUDEX) 5 % cream Apply topically daily as needed.     fluticasone (FLONASE) 50 MCG/ACT nasal spray Place 2 sprays into both nostrils as needed for allergies.      folic acid (FOLVITE) 400 MCG tablet Take 400 mcg by mouth daily.     furosemide (LASIX) 20 MG tablet Take 20 mg by mouth as needed for fluid.     levothyroxine (SYNTHROID) 88 MCG tablet Take 88 mcg by mouth daily.      Multiple Vitamins-Minerals (CENTRUM SILVER 50+WOMEN) TABS Take 1 tablet by mouth daily.     Omega-3 Fatty Acids (FISH OIL) 1000 MG CAPS Take 1,000 mg by mouth daily.      omeprazole (PRILOSEC) 20 MG capsule  Take 20 mg by mouth daily.     TURMERIC PO Take 550 mg by mouth daily.     vitamin B-12 (CYANOCOBALAMIN) 500 MCG tablet Take 500 mcg by mouth daily.     vitamin E 180 MG (400 UNITS) capsule Take 400 Units by mouth daily.      No current facility-administered medications for this visit.     Past Surgical History:  Procedure Laterality Date   ATRIAL FIBRILLATION ABLATION N/A 08/30/2018   Procedure: ATRIAL FIBRILLATION ABLATION;  Surgeon: Jolly Needle, MD;  Location: [facility];  Service: Cardiovascular;  Laterality: N/A;   BASAL CELL CARCINOMA EXCISION     HYSTERECTOMY ABDOMINAL WITH SALPINGECTOMY     implantable loop recorder implantation  12/19/2018    Medtronic Reveal [place] model LNQ11 (SN [id number])  implantable loop recorder implanted by Dr Nunzio Belch in office for afib management post ablation   implantable loop recorder removal  06/12/2021   MDT reveal LINQ removed by Dr Nunzio Belch    TONSILLECTOMY AND ADENOIDECTOMY       Allergies  Allergen Reactions   Melatonin Itching    "Keeps me awake"   Pseudoephedrine     Elevated BP   Rosuvastatin     headache   Apixaban Other (See Comments)   Benadryl [Diphenhydramine] Itching   Prednisone     Went into afib   Rivaroxaban Other (See Comments)      Family History  Problem Relation Age of Onset   Thyroid disease Mother    Atrial fibrillation Mother    Fibromyalgia Mother    Arthritis Mother    Emphysema Father    Diabetes Father    Heart disease Father    Cancer Father        gall bladder     Social History Paige Jones reports that she has never smoked. She has never used smokeless tobacco. Paige Jones reports no history of alcohol use.     Physical Examination Today's Vitals   07/07/23 1341  BP: 130/88  Pulse: 67  SpO2: 100%  Weight: 157 lb 9.6 oz (71.5 kg)  Height: 5\' 2"  (1.575 m)   Body mass index is 28.83 kg/m.  Gen: resting comfortably, no acute distress HEENT: no scleral icterus, pupils equal round and reactive, no palptable cervical adenopathy,  CV: RRR, 2/6 systolic murmur rusb, no jvd Resp: Clear to auscultation bilaterally GI: abdomen is soft, non-tender, non-distended, normal bowel sounds, no hepatosplenomegaly MSK: extremities are warm, no edema.  Skin: warm, no rash Neuro:  no focal deficits Psych: appropriate affect    Assessment and Plan   1.Afib - prior ablation, doing well without symptoms. From notes no recurrence by ILR after ablation and has not resumed anticoag -no significant recent symptoms, continue to monitor - EKG today shows NSR   2. HTN -overall at goal, continue current meds   3. HLD - continue statin    F/u 6 months     Laurann Pollock, M.D.

## 2023-07-07 NOTE — Patient Instructions (Addendum)
 Medication Instructions:  Continue all current medications.   Labwork: none  Testing/Procedures: none  Follow-Up: 6 months   Any Other Special Instructions Will Be Listed Below (If Applicable).   If you need a refill on your cardiac medications before your next appointment, please call your pharmacy.

## 2023-07-23 ENCOUNTER — Ambulatory Visit: Payer: Medicare Other | Admitting: Cardiovascular Disease

## 2023-09-08 ENCOUNTER — Other Ambulatory Visit: Payer: Self-pay

## 2023-09-27 ENCOUNTER — Encounter: Payer: Self-pay | Admitting: Cardiology

## 2023-10-05 ENCOUNTER — Encounter: Payer: Self-pay | Admitting: Cardiology

## 2023-10-11 ENCOUNTER — Encounter: Payer: Self-pay | Admitting: Cardiology

## 2023-10-11 NOTE — Telephone Encounter (Signed)
 Can stop atorvastatin , please start pravastatin  20mg  daily  JINNY Ross MD

## 2023-10-13 MED ORDER — PRAVASTATIN SODIUM 20 MG PO TABS: 20.0000 mg | ORAL_TABLET | Freq: Every day | ORAL | 2 refills | Status: DC

## 2023-11-12 ENCOUNTER — Encounter: Payer: Self-pay | Admitting: Radiology

## 2024-01-07 ENCOUNTER — Ambulatory Visit: Admitting: Cardiology

## 2024-01-07 ENCOUNTER — Telehealth: Payer: Self-pay | Admitting: Cardiology

## 2024-01-07 NOTE — Telephone Encounter (Signed)
 Pt was switched form atorvastatin  to Pravastatin  and she believes this medication is making her fatigued. She said her energy levels have dropped off and when she dont take it she feels fine.

## 2024-01-18 NOTE — Telephone Encounter (Signed)
 Left message for patient to call the office.

## 2024-01-18 NOTE — Telephone Encounter (Signed)
 Per Dr. Alvan:  Can hold pravastatin  for 2 weeks and then update us  on symptoms. Hold on any replacement at this time   JINNY Alvan MD  Advised patient of his instructions and recommendations. She stated that she hasn't taken a few dosages and that she can tell she isn't fatigue. She will report back on 11/11 to let us  know how she is doing

## 2024-01-24 ENCOUNTER — Encounter: Payer: Self-pay | Admitting: Radiology

## 2024-01-26 HISTORY — PX: COLONOSCOPY: SHX5424

## 2024-02-01 ENCOUNTER — Ambulatory Visit: Attending: Cardiology | Admitting: Cardiology

## 2024-02-01 ENCOUNTER — Encounter: Payer: Self-pay | Admitting: Cardiology

## 2024-02-01 VITALS — BP 132/82 | HR 77 | Ht 62.0 in | Wt 160.8 lb

## 2024-02-01 DIAGNOSIS — Z79899 Other long term (current) drug therapy: Secondary | ICD-10-CM | POA: Diagnosis not present

## 2024-02-01 DIAGNOSIS — I480 Paroxysmal atrial fibrillation: Secondary | ICD-10-CM

## 2024-02-01 DIAGNOSIS — E782 Mixed hyperlipidemia: Secondary | ICD-10-CM | POA: Diagnosis not present

## 2024-02-01 DIAGNOSIS — I10 Essential (primary) hypertension: Secondary | ICD-10-CM

## 2024-02-01 NOTE — Patient Instructions (Signed)
 Medication Instructions:   Continue all current medications.   Labwork:  FLP - order given today Reminder:  Nothing to eat or drink after 12 midnight prior to labs. Office will contact with results via phone, letter or mychart.     Testing/Procedures:  none  Follow-Up:  6 months   Any Other Special Instructions Will Be Listed Below (If Applicable).   If you need a refill on your cardiac medications before your next appointment, please call your pharmacy.

## 2024-02-01 NOTE — Progress Notes (Signed)
 Clinical Summary Paige Jones is a 73 y.o.female seen today for follow up of the following medical problems.     1.PAF - prior ablation 08/2018 - had extended loop recoreder after ablation without recurrent afib. Has been taken off anticoag 04/2022 30 day monitor: Sinus rhythm HR 53-139, avg 74 bpm Symptoms of flutter and skipped beats were associated with sinus rhythm. Brief atrial runs occurred. Overall burden of ectopy was < 1%  - from prior notes failed flecanide, multaq  ineffective  - no recent symptosm - compliant with meds     2.HTN - dizziness on losartan, we stopped the medication and f/u home bp's were fine.  - pcp restarted losartan, tolerating well     3. HLD - has been on atorvastatin  20mg  several years - recent muscle aches - headaches aches crestor - 02/2022 TC 157 TG 141 HDL 48 LDL 84    Jan 2025 TC 166 TG 96 HDL 58 LDL 90    - - reported fatigue on pravastatin  - muscle aches on atorvatatin, tried taking every other day   4. Aortic valve sclerosis - Jan 2024 echo: LVEF 60-65%, no WMAs, mild calcification of AV without stenosis.      Husband is Johnnisha Forton who is also a patient.  Past Medical History:  Diagnosis Date   Arachnoid cyst    Arthritis    Basal cell carcinoma    Hyperlipidemia    Hypertension    Paroxysmal atrial fibrillation (HCC)    Thyroid  goiter      Allergies  Allergen Reactions   Melatonin Itching    Keeps me awake   Pseudoephedrine     Elevated BP   Rosuvastatin     headache   Apixaban  Other (See Comments)   Benadryl [Diphenhydramine] Itching   Prednisone      Went into afib   Rivaroxaban  Other (See Comments)     Current Outpatient Medications  Medication Sig Dispense Refill   acetaminophen  (TYLENOL ) 500 MG tablet Take 500 mg by mouth every 6 (six) hours as needed for mild pain.     azelastine (ASTELIN) 0.1 % nasal spray Place 1 spray into the nose 2 (two) times daily as needed for allergies.      diclofenac   (VOLTAREN ) 75 MG EC tablet TAKE (1) TABLET BY MOUTH TWICE DAILY. (Patient taking differently: Take 75 mg by mouth daily.) 180 tablet 0   diclofenac  sodium (VOLTAREN ) 1 % GEL Apply 4 g topically 4 (four) times daily. 100 g 3   estradiol (VIVELLE-DOT) 0.025 MG/24HR Place 1 patch onto the skin 2 (two) times a week.     fenofibrate 160 MG tablet TAKE ONE TABLET BY MOUTH ONCE DAILY. 90 tablet 0   fluorouracil (EFUDEX) 5 % cream Apply topically daily as needed.     fluticasone  (FLONASE ) 50 MCG/ACT nasal spray Place 2 sprays into both nostrils as needed for allergies.      folic acid (FOLVITE) 400 MCG tablet Take 400 mcg by mouth daily.     furosemide  (LASIX ) 20 MG tablet Take 20 mg by mouth as needed for fluid.     levothyroxine  (SYNTHROID ) 88 MCG tablet Take 88 mcg by mouth daily.      Multiple Vitamins-Minerals (CENTRUM SILVER 50+WOMEN) TABS Take 1 tablet by mouth daily.     Omega-3 Fatty Acids (FISH OIL) 1000 MG CAPS Take 1,000 mg by mouth daily.      omeprazole  (PRILOSEC) 20 MG capsule Take 20 mg by mouth daily.  pravastatin  (PRAVACHOL ) 20 MG tablet Take 1 tablet (20 mg total) by mouth daily. 30 tablet 2   TURMERIC PO Take 550 mg by mouth daily.     vitamin B-12 (CYANOCOBALAMIN) 500 MCG tablet Take 500 mcg by mouth daily.     vitamin E 180 MG (400 UNITS) capsule Take 400 Units by mouth daily.      No current facility-administered medications for this visit.     Past Surgical History:  Procedure Laterality Date   ATRIAL FIBRILLATION ABLATION N/A 08/30/2018   Procedure: ATRIAL FIBRILLATION ABLATION;  Surgeon: Kelsie Agent, MD;  Location: [facility];  Service: Cardiovascular;  Laterality: N/A;   BASAL CELL CARCINOMA EXCISION     HYSTERECTOMY ABDOMINAL WITH SALPINGECTOMY     implantable loop recorder implantation  12/19/2018    Medtronic Reveal [place] model LNQ11 (SN [id number])  implantable loop recorder implanted by Dr Kelsie in office for afib management post ablation    implantable loop recorder removal  06/12/2021   MDT reveal LINQ removed by Dr Kelsie   TONSILLECTOMY AND ADENOIDECTOMY       Allergies  Allergen Reactions   Melatonin Itching    Keeps me awake   Pseudoephedrine     Elevated BP   Rosuvastatin     headache   Apixaban  Other (See Comments)   Benadryl [Diphenhydramine] Itching   Prednisone      Went into afib   Rivaroxaban  Other (See Comments)      Family History  Problem Relation Age of Onset   Thyroid  disease Mother    Atrial fibrillation Mother    Fibromyalgia Mother    Arthritis Mother    Emphysema Father    Diabetes Father    Heart disease Father    Cancer Father        gall bladder     Social History Ms. Serrao reports that she has never smoked. She has never used smokeless tobacco. Ms. Olivos reports no history of alcohol use.    Physical Examination Today's Vitals   02/01/24 1028  BP: 132/82  Pulse: 77  SpO2: 97%  Weight: 160 lb 12.8 oz (72.9 kg)  Height: 5' 2 (1.575 m)   Body mass index is 29.41 kg/m.  Gen: resting comfortably, no acute distress HEENT: no scleral icterus, pupils equal round and reactive, no palptable cervical adenopathy,  CV: RRR, no mrg, no jvd Resp: Clear to auscultation bilaterally GI: abdomen is soft, non-tender, non-distended, normal bowel sounds, no hepatosplenomegaly MSK: extremities are warm, no edema.  Skin: warm, no rash Neuro:  no focal deficits Psych: appropriate affect  Assessment and Plan   1.Afib - prior ablation, doing well without symptoms. From notes no recurrence by ILR after ablation and has not resumed anticoag -no symptoms, continue to monitor   2. HTN - at goal, continue current meds   3. HLD - has not tolerated crestor, lipitor, pravastatin  - check lipid panel, pending results likely start zetia 10mg  daily   F/u 6 months    Dorn PHEBE Ross, M.D.

## 2024-02-05 ENCOUNTER — Other Ambulatory Visit: Payer: Self-pay | Admitting: Cardiology

## 2024-02-05 LAB — LIPID PANEL
Chol/HDL Ratio: 4.2 ratio (ref 0.0–4.4)
Cholesterol, Total: 204 mg/dL — ABNORMAL HIGH (ref 100–199)
HDL: 49 mg/dL (ref >39)
LDL Chol Calc (NIH): 131 mg/dL — ABNORMAL HIGH (ref 0–99)
Triglycerides: 137 mg/dL (ref 0–149)
VLDL Cholesterol Cal: 24 mg/dL (ref 5–40)

## 2024-02-08 ENCOUNTER — Ambulatory Visit: Attending: Internal Medicine | Admitting: Internal Medicine

## 2024-02-08 ENCOUNTER — Encounter: Payer: Self-pay | Admitting: Internal Medicine

## 2024-02-08 VITALS — BP 132/84 | HR 69 | Ht 62.0 in | Wt 154.6 lb

## 2024-02-08 DIAGNOSIS — I480 Paroxysmal atrial fibrillation: Secondary | ICD-10-CM | POA: Diagnosis not present

## 2024-02-08 NOTE — Patient Instructions (Signed)

## 2024-02-08 NOTE — Progress Notes (Signed)
 HPI Paige Jones is a 73 y.o. female who presents today for routine electrophysiology followup.  She has a h/o PAF and is s/p catheter ablation.  She has had a few episodes of palpitations.  The patient denies syncope and she wore a cardiac monitor which showed nonsustained AT but no atrial fib. She also had symptomatic sinus tachycardia. Previously after  her ablation she had an ILR inserted which did not show any sustained atrial fib. She has rare palpitations lasting seconds.  Allergies  Allergen Reactions   Melatonin Itching    Keeps me awake   Pseudoephedrine     Elevated BP   Rosuvastatin     headache   Apixaban  Other (See Comments)   Benadryl [Diphenhydramine] Itching   Prednisone      Went into afib   Rivaroxaban  Other (See Comments)     Current Outpatient Medications  Medication Sig Dispense Refill   acetaminophen  (TYLENOL ) 500 MG tablet Take 500 mg by mouth every 6 (six) hours as needed for mild pain.     azelastine (ASTELIN) 0.1 % nasal spray Place 1 spray into the nose 2 (two) times daily as needed for allergies.      diclofenac  sodium (VOLTAREN ) 1 % GEL Apply 4 g topically 4 (four) times daily. 100 g 3   estradiol (VIVELLE-DOT) 0.025 MG/24HR Place 1 patch onto the skin 2 (two) times a week.     fenofibrate 160 MG tablet TAKE ONE TABLET BY MOUTH ONCE DAILY. 90 tablet 0   fluorouracil (EFUDEX) 5 % cream Apply topically daily as needed.     fluticasone  (FLONASE ) 50 MCG/ACT nasal spray Place 2 sprays into both nostrils as needed for allergies.      furosemide  (LASIX ) 20 MG tablet Take 20 mg by mouth as needed for fluid.     levothyroxine  (SYNTHROID ) 88 MCG tablet Take 88 mcg by mouth daily.      losartan (COZAAR) 25 MG tablet Take 25 mg by mouth daily.     Multiple Vitamins-Minerals (CENTRUM SILVER 50+WOMEN) TABS Take 1 tablet by mouth daily.     Omega-3 Fatty Acids (FISH OIL) 1000 MG CAPS Take 1,000 mg by mouth daily.      omeprazole  (PRILOSEC) 20 MG capsule  Take 20 mg by mouth daily.     TURMERIC PO Take 550 mg by mouth daily.     vitamin B-12 (CYANOCOBALAMIN) 500 MCG tablet Take 500 mcg by mouth daily.     vitamin E 180 MG (400 UNITS) capsule Take 400 Units by mouth daily.      diclofenac  (VOLTAREN ) 75 MG EC tablet TAKE (1) TABLET BY MOUTH TWICE DAILY. (Patient taking differently: Take 75 mg by mouth daily.) 180 tablet 0   folic acid (FOLVITE) 400 MCG tablet Take 400 mcg by mouth daily. (Patient not taking: Reported on 02/08/2024)     pravastatin  (PRAVACHOL ) 20 MG tablet Take 1 tablet (20 mg total) by mouth daily. (Patient not taking: Reported on 02/08/2024) 30 tablet 2   No current facility-administered medications for this visit.     Past Medical History:  Diagnosis Date   Arachnoid cyst    Arthritis    Basal cell carcinoma    Hyperlipidemia    Hypertension    Paroxysmal atrial fibrillation (HCC)    Thyroid  goiter     ROS:   All systems reviewed and negative except as noted in the HPI.   Past Surgical History:  Procedure Laterality Date   ATRIAL  FIBRILLATION ABLATION N/A 08/30/2018   Procedure: ATRIAL FIBRILLATION ABLATION;  Surgeon: Kelsie Agent, MD;  Location: [facility];  Service: Cardiovascular;  Laterality: N/A;   BASAL CELL CARCINOMA EXCISION     COLONOSCOPY  01/26/2024   HYSTERECTOMY ABDOMINAL WITH SALPINGECTOMY     implantable loop recorder implantation  12/19/2018    Medtronic Reveal Spelter model LNQ11 (SN [id number])  implantable loop recorder implanted by Dr Kelsie in office for afib management post ablation   implantable loop recorder removal  06/12/2021   MDT reveal LINQ removed by Dr Kelsie   TONSILLECTOMY AND ADENOIDECTOMY       Family History  Problem Relation Age of Onset   Thyroid  disease Mother    Atrial fibrillation Mother    Fibromyalgia Mother    Arthritis Mother    Emphysema Father    Diabetes Father    Heart disease Father    Cancer Father        gall bladder     Social History    Socioeconomic History   Marital status: Married    Spouse name: Not on file   Number of children: Not on file   Years of education: Not on file   Highest education level: Not on file  Occupational History   Not on file  Tobacco Use   Smoking status: Never   Smokeless tobacco: Never  Vaping Use   Vaping status: Never Used  Substance and Sexual Activity   Alcohol use: Yes   Drug use: No   Sexual activity: Not on file  Other Topics Concern   Not on file  Social History Narrative   Lives in [place] with spouse.   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   Social Drivers of Corporate Investment Banker Strain: Low Risk  (07/28/2023)   Received from [facility]   Overall Financial Resource Strain (CARDIA)    Difficulty of Paying Living Expenses: Not hard at all  Food Insecurity: No Food Insecurity (07/28/2023)   Received from [facility]   Hunger Vital Sign    Within the past 12 months, you worried that your food would run out before you got the money to buy more.: Never true    Within the past 12 months, the food you bought just didn't last and you didn't have money to get more.: Never true  Transportation Needs: No Transportation Needs (07/28/2023)   Received from [facility] - Transportation    Lack of Transportation (Medical): No    Lack of Transportation (Non-Medical): No  Physical Activity: Insufficiently Active (07/28/2023)   Received from [facility]   Exercise Vital Sign    On average, how many days per week do you engage in moderate to strenuous exercise (like a brisk walk)?: 3 days    On average, how many minutes do you engage in exercise at this level?: 40 min  Stress: No Stress Concern Present (07/28/2023)   Received from [facility] - Occupational Stress Questionnaire    Feeling of Stress : Not at all  Social Connections: Moderately Integrated (07/28/2023)   Received  from [facility]   Social Network    How would you rate your social network (family, work, friends)?: Adequate participation with social networks  Intimate Partner Violence: Not At Risk (07/28/2023)   Received from [facility]   HITS    Over the last 12 months how  often did your partner physically hurt you?: Never    Over the last 12 months how often did your partner insult you or talk down to you?: Never    Over the last 12 months how often did your partner threaten you with physical harm?: Never    Over the last 12 months how often did your partner scream or curse at you?: Never     BP 132/84 (BP Location: Left Arm, Cuff Size: Normal)   Pulse 69   Ht 5' 2 (1.575 m)   Wt 154 lb 9.6 oz (70.1 kg)   SpO2 98%   BMI 28.28 kg/m   Physical Exam:  Well appearing NAD HEENT: Unremarkable Neck:  No JVD, no thyromegally Lymphatics:  No adenopathy Back:  No CVA tenderness Lungs:  Clear HEART:  Regular rate rhythm, no murmurs, no rubs, no clicks Abd:  soft, positive bowel sounds, no organomegally, no rebound, no guarding Ext:  2 plus pulses, no edema, no cyanosis, no clubbing Skin:  No rashes no nodules Neuro:  CN II through XII intact, motor grossly intact  Assess/Plan:  Paroxysmal atrial fibrillation Well controlled post ablation She has not had any symptomatic atrial fib. No indication for anti-coag at this point. I might consider starting at age 70 once CHADSVASC =3.   Paige Shaasia Odle,MD

## 2024-03-03 ENCOUNTER — Other Ambulatory Visit: Payer: Self-pay | Admitting: Orthopedic Surgery

## 2024-03-03 DIAGNOSIS — M5416 Radiculopathy, lumbar region: Secondary | ICD-10-CM

## 2024-03-08 ENCOUNTER — Ambulatory Visit: Payer: Self-pay | Admitting: Cardiology

## 2024-03-10 MED ORDER — EZETIMIBE 10 MG PO TABS: 10.0000 mg | ORAL_TABLET | Freq: Every day | ORAL | 3 refills | Status: AC

## 2024-03-13 ENCOUNTER — Ambulatory Visit: Admitting: Nurse Practitioner

## 2024-03-20 ENCOUNTER — Other Ambulatory Visit

## 2024-04-17 ENCOUNTER — Other Ambulatory Visit

## 2024-04-21 ENCOUNTER — Ambulatory Visit
Admission: RE | Admit: 2024-04-21 | Discharge: 2024-04-21 | Disposition: A | Source: Ambulatory Visit | Attending: Orthopedic Surgery

## 2024-04-21 DIAGNOSIS — M5416 Radiculopathy, lumbar region: Secondary | ICD-10-CM

## 2024-05-12 ENCOUNTER — Ambulatory Visit: Admitting: Physical Therapy
